# Patient Record
Sex: Female | Born: 1995 | Race: Black or African American | Hispanic: No | Marital: Single | Smoking: Former smoker
Health system: Southern US, Community
[De-identification: ages and names within clinical notes are randomized; demographics above are authoritative.]

## PROBLEM LIST (undated history)

## (undated) DIAGNOSIS — J45909 Unspecified asthma, uncomplicated: Secondary | ICD-10-CM

## (undated) DIAGNOSIS — K8050 Calculus of bile duct without cholangitis or cholecystitis without obstruction: Secondary | ICD-10-CM

## (undated) DIAGNOSIS — R519 Headache, unspecified: Secondary | ICD-10-CM

## (undated) DIAGNOSIS — J40 Bronchitis, not specified as acute or chronic: Secondary | ICD-10-CM

## (undated) DIAGNOSIS — R51 Headache: Secondary | ICD-10-CM

## (undated) DIAGNOSIS — T7840XA Allergy, unspecified, initial encounter: Secondary | ICD-10-CM

## (undated) DIAGNOSIS — T1490XA Injury, unspecified, initial encounter: Secondary | ICD-10-CM

## (undated) DIAGNOSIS — E669 Obesity, unspecified: Secondary | ICD-10-CM

## (undated) DIAGNOSIS — G8929 Other chronic pain: Secondary | ICD-10-CM

## (undated) DIAGNOSIS — F909 Attention-deficit hyperactivity disorder, unspecified type: Secondary | ICD-10-CM

## (undated) DIAGNOSIS — W3400XA Accidental discharge from unspecified firearms or gun, initial encounter: Secondary | ICD-10-CM

## (undated) DIAGNOSIS — S71139A Puncture wound without foreign body, unspecified thigh, initial encounter: Secondary | ICD-10-CM

## (undated) DIAGNOSIS — N926 Irregular menstruation, unspecified: Principal | ICD-10-CM

## (undated) DIAGNOSIS — E119 Type 2 diabetes mellitus without complications: Secondary | ICD-10-CM

## (undated) DIAGNOSIS — E161 Other hypoglycemia: Secondary | ICD-10-CM

## (undated) DIAGNOSIS — I10 Essential (primary) hypertension: Secondary | ICD-10-CM

## (undated) HISTORY — DX: Attention-deficit hyperactivity disorder, unspecified type: F90.9

## (undated) HISTORY — DX: Type 2 diabetes mellitus without complications: E11.9

## (undated) HISTORY — DX: Calculus of bile duct without cholangitis or cholecystitis without obstruction: K80.50

## (undated) HISTORY — DX: Other chronic pain: G89.29

## (undated) HISTORY — PX: TONSILLECTOMY: SUR1361

## (undated) HISTORY — PX: WISDOM TOOTH EXTRACTION: SHX21

## (undated) HISTORY — DX: Puncture wound without foreign body, unspecified thigh, initial encounter: S71.139A

## (undated) HISTORY — DX: Irregular menstruation, unspecified: N92.6

## (undated) HISTORY — DX: Headache, unspecified: R51.9

## (undated) HISTORY — DX: Accidental discharge from unspecified firearms or gun, initial encounter: W34.00XA

## (undated) HISTORY — DX: Essential (primary) hypertension: I10

## (undated) HISTORY — DX: Allergy, unspecified, initial encounter: T78.40XA

## (undated) HISTORY — DX: Morbid (severe) obesity due to excess calories: E66.01

## (undated) HISTORY — DX: Other hypoglycemia: E16.1

## (undated) HISTORY — DX: Obesity, unspecified: E66.9

## (undated) HISTORY — DX: Injury, unspecified, initial encounter: T14.90XA

## (undated) HISTORY — PX: OTHER SURGICAL HISTORY: SHX169

## (undated) HISTORY — DX: Headache: R51

---
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
## 2013-02-18 ENCOUNTER — Encounter (HOSPITAL_COMMUNITY): Payer: Self-pay

## 2013-02-18 ENCOUNTER — Emergency Department (HOSPITAL_COMMUNITY)
Admission: EM | Admit: 2013-02-18 | Discharge: 2013-02-18 | Disposition: A | Discharge status: Home / Self Care | Payer: Medicaid Other | Attending: Emergency Medicine | Admitting: Emergency Medicine

## 2013-02-18 DIAGNOSIS — Z880 Allergy status to penicillin: Secondary | ICD-10-CM | POA: Insufficient documentation

## 2013-02-18 DIAGNOSIS — N898 Other specified noninflammatory disorders of vagina: Secondary | ICD-10-CM | POA: Insufficient documentation

## 2013-02-18 DIAGNOSIS — J45909 Unspecified asthma, uncomplicated: Secondary | ICD-10-CM | POA: Insufficient documentation

## 2013-02-18 DIAGNOSIS — N939 Abnormal uterine and vaginal bleeding, unspecified: Secondary | ICD-10-CM

## 2013-02-18 DIAGNOSIS — E119 Type 2 diabetes mellitus without complications: Secondary | ICD-10-CM

## 2013-02-18 DIAGNOSIS — Z3202 Encounter for pregnancy test, result negative: Secondary | ICD-10-CM | POA: Insufficient documentation

## 2013-02-18 LAB — URINE MICROSCOPIC-ADD ON

## 2013-02-18 LAB — URINALYSIS, ROUTINE W REFLEX MICROSCOPIC
Glucose, UA: >1000 mg/dL — AB
Leukocytes, UA: NEGATIVE
Nitrite: NEGATIVE
Specific Gravity, Urine: 1.02 (ref 1.005–1.030)
pH: 6 (ref 5.0–8.0)

## 2013-02-18 LAB — PREGNANCY, URINE: Preg Test, Ur: NEGATIVE

## 2013-02-18 LAB — CBC WITH DIFFERENTIAL/PLATELET
Basophils Absolute: 0 10*3/uL (ref 0.0–0.1)
Lymphocytes Relative: 33 % (ref 12–46)
Lymphs Abs: 3.1 10*3/uL (ref 0.7–4.0)
Neutro Abs: 5.6 10*3/uL (ref 1.7–7.7)
Platelets: 351 10*3/uL (ref 150–400)
RBC: 5.2 MIL/uL — ABNORMAL HIGH (ref 3.87–5.11)
RDW: 14 % (ref 11.5–15.5)
WBC: 9.3 10*3/uL (ref 4.0–10.5)

## 2013-02-18 LAB — BASIC METABOLIC PANEL
CO2: 25 mEq/L (ref 19–32)
Calcium: 9.7 mg/dL (ref 8.4–10.5)
Chloride: 95 mEq/L — ABNORMAL LOW (ref 96–112)
Creatinine, Ser: 0.65 mg/dL (ref 0.50–1.10)
GFR calc Af Amer: >90 mL/min (ref >90)
Sodium: 132 mEq/L — ABNORMAL LOW (ref 135–145)

## 2013-02-18 LAB — WET PREP, GENITAL: Clue Cells Wet Prep HPF POC: NONE SEEN

## 2013-02-18 MED ORDER — METFORMIN HCL 500 MG PO TABS: 500.0000 mg | ORAL_TABLET | Freq: Two times a day (BID) | ORAL | Status: DC

## 2013-02-18 NOTE — ED Notes (Signed)
Patient with no complaints at this time. Respirations even and unlabored. Skin warm/dry. Discharge instructions reviewed with patient at this time. Patient given opportunity to voice concerns/ask questions. Patient discharged at this time and left Emergency Department with steady gait.   

## 2013-02-18 NOTE — ED Provider Notes (Signed)
History  This chart was scribed for Donnetta Hutching, MD by Bennett Scrape, ED Scribe. This patient was seen in room APA09/APA09 and the patient's care was started at 3:45 PM.  CSN: [id number]  Arrival date & time 02/18/13  1513   First MD Initiated Contact with Patient 02/18/13 1539      Chief Complaint  Patient presents with  . Vaginal Bleeding     The history is provided by the patient. No language interpreter was used.    HPI Comments: Michelle Tate is a 17 y.o. female who presents to the Emergency Department complaining of 6 days of gradually worsening, constant vaginal bleeding described as heavy with associated lower abdominal cramps attributed to her menses. Mother reports that she became concerned when the pt used the restroom today and passed several large clots. Mother states that the clots reminded her of a miscarriage and wanted the pt pregnancy tested. Pt admits that she is sexually active but uses condoms and has the implanon placed in January 2014. She reports not having a menses in February, a normal menses in March and a heavier than normal menses in April. She denies nausea, emesis, weakness and HA as associated symptoms. She has a h/o asthma and denies smoking and alcohol use.  NP Victorino Dike at [facility] PCP is Dr. Gerda Diss  Past Medical History  Diagnosis Date  . Asthma   . Bronchitis     Past Surgical History  Procedure Laterality Date  . Implanon placement    . Tonsillectomy      No family history on file.  History  Substance Use Topics  . Smoking status: Never Smoker   . Smokeless tobacco: Not on file  . Alcohol Use: No    No OB history provided.  Review of Systems  A complete 10 system review of systems was obtained and all systems are negative except as noted in the HPI and PMH.   Allergies  Penicillins and Sulfa drugs cross reactors  Home Medications   Current Outpatient Rx  Name  Route  Sig  Dispense  Refill  . naproxen (NAPROSYN) 500 MG  tablet   Oral   Take 1 tablet (500 mg total) by mouth 2 (two) times daily.   15 tablet   0     Triage Vitals: BP 153/93  Pulse 115  Temp(Src) 99.3 F (37.4 C) (Oral)  Resp 20  Ht 5\' 5"  (1.651 m)  Wt 300 lb (136.079 kg)  BMI 49.92 kg/m2  SpO2 100%  LMP 02/11/2013  Physical Exam  Nursing note and vitals reviewed. Constitutional: She is oriented to person, place, and time. She appears well-developed and well-nourished.  Obese   HENT:  Head: Normocephalic and atraumatic.  Eyes: Conjunctivae and EOM are normal. Pupils are equal, round, and reactive to light.  Neck: Normal range of motion. Neck supple.  Cardiovascular: Normal rate, regular rhythm and normal heart sounds.   Pulmonary/Chest: Effort normal and breath sounds normal.  Abdominal: Soft. Bowel sounds are normal. There is tenderness (minimal lower abdominal tenderness). There is no rebound and no guarding.  Genitourinary:  Normal external female genitalia, small amount of blood in the vaginal vault, no active bleeding from os, no CMT, normal adnexa, chaperone present  Musculoskeletal: Normal range of motion.  Neurological: She is alert and oriented to person, place, and time.  Skin: Skin is warm and dry.  Psychiatric: She has a normal mood and affect.    ED Course  Procedures (including critical care  time)  DIAGNOSTIC STUDIES: Oxygen Saturation is 100% on room air, normal by my interpretation.    COORDINATION OF CARE: 3:54 PM-Discussed treatment plan which includes pelvic exam, CBC panel, BMP and UA with pt at bedside and pt agreed to plan.   Labs Reviewed  BASIC METABOLIC PANEL - Abnormal; Notable for the following:    Sodium 132 (*)    Chloride 95 (*)    Glucose, Bld 396 (*)    All other components within normal limits  CBC WITH DIFFERENTIAL - Abnormal; Notable for the following:    RBC 5.20 (*)    MCV 77.7 (*)    MCH 25.8 (*)    All other components within normal limits  URINALYSIS, ROUTINE W REFLEX  MICROSCOPIC - Abnormal; Notable for the following:    APPearance CLOUDY (*)    Glucose, UA >1000 (*)    Hgb urine dipstick LARGE (*)    All other components within normal limits  URINE MICROSCOPIC-ADD ON - Abnormal; Notable for the following:    Squamous Epithelial / LPF FEW (*)    All other components within normal limits  PREGNANCY, URINE   No results found.   No diagnosis found.    MDM  Pelvic exam revealed minimal bleeding from os.   Glucose noted to be 396.   Will start patient on metformin 500 mg twice a day.    Will get follow up with both primary care and OB/GYN     I personally performed the services described in this documentation, which was scribed in my presence. The recorded information has been reviewed and is accurate.      Donnetta Hutching, MD 02/18/13 [phone]

## 2013-02-18 NOTE — ED Notes (Signed)
Pt reports that she has been bleeding heavy for 1 week, was on toilet apporx. 20 min pta and passed several large clots.  Mother got them out of the toilet and they want it checked to see if she was pregnant.  Has been having ab cramping all week.  Is on birth control.

## 2013-02-19 ENCOUNTER — Encounter: Payer: Self-pay | Admitting: *Deleted

## 2013-02-20 ENCOUNTER — Ambulatory Visit (INDEPENDENT_AMBULATORY_CARE_PROVIDER_SITE_OTHER): Payer: Medicaid Other | Admitting: Family Medicine

## 2013-02-20 ENCOUNTER — Encounter: Payer: Self-pay | Admitting: Family Medicine

## 2013-02-20 VITALS — BP 138/88 | HR 70 | Wt 329.0 lb

## 2013-02-20 DIAGNOSIS — N939 Abnormal uterine and vaginal bleeding, unspecified: Secondary | ICD-10-CM

## 2013-02-20 DIAGNOSIS — E1140 Type 2 diabetes mellitus with diabetic neuropathy, unspecified: Secondary | ICD-10-CM | POA: Insufficient documentation

## 2013-02-20 DIAGNOSIS — E118 Type 2 diabetes mellitus with unspecified complications: Secondary | ICD-10-CM | POA: Insufficient documentation

## 2013-02-20 DIAGNOSIS — E1165 Type 2 diabetes mellitus with hyperglycemia: Secondary | ICD-10-CM | POA: Insufficient documentation

## 2013-02-20 DIAGNOSIS — E119 Type 2 diabetes mellitus without complications: Secondary | ICD-10-CM | POA: Insufficient documentation

## 2013-02-20 DIAGNOSIS — E1169 Type 2 diabetes mellitus with other specified complication: Secondary | ICD-10-CM | POA: Insufficient documentation

## 2013-02-20 DIAGNOSIS — N898 Other specified noninflammatory disorders of vagina: Secondary | ICD-10-CM

## 2013-02-20 LAB — POCT GLYCOSYLATED HEMOGLOBIN (HGB A1C): Hemoglobin A1C: 11.7

## 2013-02-20 LAB — GC/CHLAMYDIA PROBE AMP: GC Probe RNA: NEGATIVE

## 2013-02-20 MED ORDER — METFORMIN HCL 500 MG PO TABS: ORAL_TABLET | ORAL | Status: DC

## 2013-02-20 NOTE — Progress Notes (Signed)
  Subjective:    Patient ID: Michelle Tate, female    DOB: Jan 11, 1996, 17 y.o.   MRN: [id number]  Diabetes She presents for her initial diabetic visit. She has type 2 diabetes mellitus. Onset time: unknown. There are no hypoglycemic associated symptoms. There are no hypoglycemic complications. When asked about current treatments, none were reported. She participates in exercise intermittently. An ACE inhibitor/angiotensin II receptor blocker is not being taken.   Strong fam hx of diabetes An in and patient has had a history of elevated sugar in the past. She also history of elevated insulin. Drinks a lot of soft drinks. A lot of sugar in her diet.  Review of Systems Some polyuria some polydipsia.   gradual weight gain. Objective:   Physical Exam Alert hydration decent. Blood pressure repeat 130/78. Significant obesity noted. Lungs clear. Heart regular in rhythm. Abdomen soft. H&T normal. Ankles without edema. Feet sensation intact pulses good.   Results for orders placed in visit on 02/20/13  POCT GLYCOSYLATED HEMOGLOBIN (HGB A1C)      Result Value Range   Hemoglobin A1C 11.7    GLUCOSE, POCT (MANUAL RESULT ENTRY)      Result Value Range   POC Glucose 11.7 (*) 70 - 99 mg/dl       Assessment & Plan:   impression #1 new onset diabetes type 2. Discussed at length. Bicarbonate 25 several days ago 345 glucose. Patient's diet is horrible. Plan cut supple sugars out of diet. Start walking. Glucometer prescribed. Initiate metformin. Side effects benefits discussed. Easily 35-40 minutes spent most in discussion. WSL

## 2013-02-20 NOTE — Patient Instructions (Signed)
Please stop all sugars in the diet immediately, and start exercising regulrly

## 2013-02-21 ENCOUNTER — Encounter: Payer: Self-pay | Admitting: *Deleted

## 2013-02-22 ENCOUNTER — Telehealth: Payer: Self-pay | Admitting: Adult Health

## 2013-02-22 NOTE — Telephone Encounter (Signed)
Pt having bleeding issues and has appt. 5/13 will address then

## 2013-02-26 ENCOUNTER — Encounter: Payer: Self-pay | Admitting: *Deleted

## 2013-02-26 DIAGNOSIS — J45909 Unspecified asthma, uncomplicated: Secondary | ICD-10-CM | POA: Insufficient documentation

## 2013-02-27 ENCOUNTER — Ambulatory Visit (INDEPENDENT_AMBULATORY_CARE_PROVIDER_SITE_OTHER): Payer: 59 | Admitting: Adult Health

## 2013-02-27 ENCOUNTER — Encounter: Payer: Self-pay | Admitting: Adult Health

## 2013-02-27 VITALS — BP 122/80 | Ht 64.0 in | Wt 371.6 lb

## 2013-02-27 DIAGNOSIS — E669 Obesity, unspecified: Secondary | ICD-10-CM

## 2013-02-27 DIAGNOSIS — Z3202 Encounter for pregnancy test, result negative: Secondary | ICD-10-CM

## 2013-02-27 DIAGNOSIS — N926 Irregular menstruation, unspecified: Secondary | ICD-10-CM

## 2013-02-27 HISTORY — DX: Irregular menstruation, unspecified: N92.6

## 2013-02-27 HISTORY — DX: Obesity, unspecified: E66.9

## 2013-02-27 LAB — POCT URINE PREGNANCY: Preg Test, Ur: NEGATIVE

## 2013-02-27 MED ORDER — MEGESTROL ACETATE 40 MG PO TABS: ORAL_TABLET | ORAL | Status: DC

## 2013-02-27 NOTE — Patient Instructions (Addendum)
Stop OCs and take megace and use condoms Sign up for my chart Return in 1 week for pelvic US

## 2013-02-27 NOTE — Progress Notes (Signed)
Subjective:     Patient ID: Michelle Tate, female   DOB: [birth year], 17 y.o.   MRN: [id number]  HPI Michelle Tate is a 17 year old black female with irregular bleeding. She has had bleeding since 02/01/13. And was seen in the ER 02/18/13. At that time her GC/CHL was negative and her A1c was 11.7, and she started Metformin. She went to the ER because she was passing clots. Has Prom on Saturday and does not want to bled.  Review of Systems Patient denies any headaches, blurred vision, shortness of breath, chest pain, abdominal pain, problems with bowel movements, urination, or intercourse. Positives as in HPI.    Reviewed past medical,surgical, social and family history. Reviewed medications and allergies.  Objective:   Physical Exam Blood pressure 122/80, height 5\' 4"  (1.626 m), weight 371 lb 9.6 oz (168.557 kg), last menstrual period 02/01/2013.Urine pregnancy test negative. Skin warm and dry.Pelvic: external genitalia is normal in appearance, vagina: has period type blood, no odor, cervix:smooth, with negative CMT, uterus: normal size, shape and contour, non tender, no masses felt, adnexa: no masses or tenderness noted.    Assessment:      Irregular bleeding   Recent diagnosis of diabetes  Obesity Plan:       Stop birth control pills today Start Megace 40 mg 3 x 5 days then 2 x 5 days then 1 daily, use condoms   Return in 1 week for pelvic US to assess uterine lining and see me

## 2013-03-08 ENCOUNTER — Ambulatory Visit: Payer: Medicaid Other | Admitting: Adult Health

## 2013-03-08 ENCOUNTER — Other Ambulatory Visit: Payer: Medicaid Other

## 2013-03-16 ENCOUNTER — Ambulatory Visit: Payer: Medicaid Other | Admitting: Adult Health

## 2013-03-16 ENCOUNTER — Other Ambulatory Visit: Payer: Medicaid Other

## 2013-03-27 ENCOUNTER — Other Ambulatory Visit: Payer: Self-pay | Admitting: Adult Health

## 2013-03-27 ENCOUNTER — Ambulatory Visit (INDEPENDENT_AMBULATORY_CARE_PROVIDER_SITE_OTHER): Payer: 59 | Admitting: Adult Health

## 2013-03-27 ENCOUNTER — Encounter: Payer: Self-pay | Admitting: Adult Health

## 2013-03-27 ENCOUNTER — Ambulatory Visit (INDEPENDENT_AMBULATORY_CARE_PROVIDER_SITE_OTHER): Payer: 59

## 2013-03-27 VITALS — BP 120/76 | Ht 64.0 in | Wt 373.4 lb

## 2013-03-27 DIAGNOSIS — N926 Irregular menstruation, unspecified: Secondary | ICD-10-CM

## 2013-03-27 DIAGNOSIS — E669 Obesity, unspecified: Secondary | ICD-10-CM

## 2013-03-27 DIAGNOSIS — Z309 Encounter for contraceptive management, unspecified: Secondary | ICD-10-CM

## 2013-03-27 NOTE — Progress Notes (Signed)
Subjective:     Patient ID: Michelle Tate, female   DOB: 1996/08/06, 17 y.o.   MRN: [id number]  HPI Perrie is back for [place] and review of how she is.The bleeding stopped with the megace and she started back on her birth control pills.She is feeling good. The US showed normal ovaries and some echogenic tissue in endometrium, no masses but difficult exam secondary to body habitus.  Review of Systems No complaints  Reviewed past medical,surgical, social and family history. Reviewed medications and allergies.     Objective:   Physical Exam BP 120/76  Ht 5\' 4"  (1.626 m)  Wt 373 lb 6.4 oz (169.373 kg)  BMI 64.06 kg/m2  LMP 02/24/2013 [place] reviewed with pt, Dr. Despina Hidden to review [place].    Assessment:      Contraceptive management History irregular bleeding-resolved   Obesity  Plan:      Take OCs, use condoms Follow up in 3 months Note given for work

## 2013-03-27 NOTE — Patient Instructions (Addendum)
Return in 3 months use condoms call with any problems

## 2013-07-16 ENCOUNTER — Encounter (HOSPITAL_COMMUNITY): Payer: Self-pay | Admitting: *Deleted

## 2013-07-16 ENCOUNTER — Emergency Department (HOSPITAL_COMMUNITY)
Admission: EM | Admit: 2013-07-16 | Discharge: 2013-07-16 | Disposition: A | Discharge status: Home / Self Care | Payer: 59 | Attending: Emergency Medicine | Admitting: Emergency Medicine

## 2013-07-16 DIAGNOSIS — Z8659 Personal history of other mental and behavioral disorders: Secondary | ICD-10-CM | POA: Insufficient documentation

## 2013-07-16 DIAGNOSIS — L02818 Cutaneous abscess of other sites: Secondary | ICD-10-CM | POA: Insufficient documentation

## 2013-07-16 DIAGNOSIS — R51 Headache: Secondary | ICD-10-CM | POA: Insufficient documentation

## 2013-07-16 DIAGNOSIS — E119 Type 2 diabetes mellitus without complications: Secondary | ICD-10-CM | POA: Insufficient documentation

## 2013-07-16 DIAGNOSIS — I10 Essential (primary) hypertension: Secondary | ICD-10-CM | POA: Insufficient documentation

## 2013-07-16 DIAGNOSIS — R1110 Vomiting, unspecified: Secondary | ICD-10-CM | POA: Insufficient documentation

## 2013-07-16 DIAGNOSIS — L02811 Cutaneous abscess of head [any part, except face]: Secondary | ICD-10-CM

## 2013-07-16 DIAGNOSIS — Z8742 Personal history of other diseases of the female genital tract: Secondary | ICD-10-CM | POA: Insufficient documentation

## 2013-07-16 DIAGNOSIS — Z79899 Other long term (current) drug therapy: Secondary | ICD-10-CM | POA: Insufficient documentation

## 2013-07-16 DIAGNOSIS — Z880 Allergy status to penicillin: Secondary | ICD-10-CM | POA: Insufficient documentation

## 2013-07-16 DIAGNOSIS — IMO0002 Reserved for concepts with insufficient information to code with codable children: Secondary | ICD-10-CM | POA: Insufficient documentation

## 2013-07-16 DIAGNOSIS — R000 Tachycardia, unspecified: Secondary | ICD-10-CM | POA: Insufficient documentation

## 2013-07-16 DIAGNOSIS — J45901 Unspecified asthma with (acute) exacerbation: Secondary | ICD-10-CM | POA: Insufficient documentation

## 2013-07-16 MED ORDER — CIPROFLOXACIN HCL 500 MG PO TABS: 500.0000 mg | ORAL_TABLET | Freq: Two times a day (BID) | ORAL | Status: DC

## 2013-07-16 MED ORDER — CIPROFLOXACIN HCL 250 MG PO TABS
500.0000 mg | ORAL_TABLET | Freq: Once | ORAL | Status: AC
  Administered 2013-07-16: 500 mg via ORAL
  Filled 2013-07-16: qty 2

## 2013-07-16 MED ORDER — HYDROCODONE-ACETAMINOPHEN 5-325 MG PO TABS: 1.0000 | ORAL_TABLET | ORAL | Status: DC | PRN

## 2013-07-16 MED ORDER — DOXYCYCLINE HYCLATE 100 MG PO TABS
100.0000 mg | ORAL_TABLET | Freq: Once | ORAL | Status: AC
  Administered 2013-07-16: 100 mg via ORAL
  Filled 2013-07-16: qty 1

## 2013-07-16 MED ORDER — ONDANSETRON HCL 4 MG PO TABS
4.0000 mg | ORAL_TABLET | Freq: Once | ORAL | Status: AC
  Administered 2013-07-16: 4 mg via ORAL
  Filled 2013-07-16: qty 1

## 2013-07-16 MED ORDER — KETOROLAC TROMETHAMINE 10 MG PO TABS
10.0000 mg | ORAL_TABLET | Freq: Once | ORAL | Status: AC
  Administered 2013-07-16: 10 mg via ORAL
  Filled 2013-07-16: qty 1

## 2013-07-16 MED ORDER — MUPIROCIN CALCIUM 2 % EX CREA: TOPICAL_CREAM | Freq: Three times a day (TID) | CUTANEOUS | Status: DC

## 2013-07-16 MED ORDER — DOXYCYCLINE HYCLATE 100 MG PO CAPS: 100.0000 mg | ORAL_CAPSULE | Freq: Two times a day (BID) | ORAL | Status: AC

## 2013-07-16 NOTE — ED Provider Notes (Signed)
CSN: [id number]     Arrival date & time 07/16/13  1559 History   First MD Initiated Contact with Patient 07/16/13 1653     Chief Complaint  Patient presents with  . Abscess   (Consider location/radiation/quality/duration/timing/severity/associated sxs/prior Treatment) Patient is a 17 y.o. female presenting with abscess. The history is provided by the patient.  Abscess Location:  Head/neck Head/neck abscess location:  Head and scalp Duration:  3 days Progression:  Worsening Chronicity:  New Context: diabetes   Relieved by:  Nothing Worsened by:  Draining/squeezing Ineffective treatments:  None tried Associated symptoms: headaches and vomiting   Associated symptoms: no fever     Past Medical History  Diagnosis Date  . Asthma   . Bronchitis   . ADHD (attention deficit hyperactivity disorder)   . Allergy   . Chronic headache   . Hypertension   . Hyperinsulinemia   . Morbid obesity   . Diabetes mellitus without complication   . Irregular bleeding 02/27/2013  . Obesity 02/27/2013   Past Surgical History  Procedure Laterality Date  . Implanon placement    . Tonsillectomy    . Wisdom tooth extraction     Family History  Problem Relation Age of Onset  . Stroke Other   . Cancer Other   . Diabetes Paternal Grandmother   . Hypertension Paternal Grandmother   . Hypertension Maternal Grandmother   . Heart disease Father   . Hypertension Father   . Seizures Mother   . Thyroid disease Cousin    History  Substance Use Topics  . Smoking status: Never Smoker   . Smokeless tobacco: Never Used  . Alcohol Use: No   OB History   Grav Para Term Preterm Abortions TAB SAB Ect Mult Living                 Review of Systems  Constitutional: Negative for fever and activity change.       All ROS Neg except as noted in HPI  HENT: Negative for nosebleeds and neck pain.   Eyes: Negative for photophobia and discharge.  Respiratory: Positive for wheezing. Negative for cough and  shortness of breath.   Cardiovascular: Negative for chest pain and palpitations.  Gastrointestinal: Positive for vomiting. Negative for abdominal pain and blood in stool.  Genitourinary: Negative for dysuria, frequency and hematuria.  Musculoskeletal: Negative for back pain and arthralgias.  Skin: Positive for wound.  Neurological: Positive for headaches. Negative for dizziness, seizures and speech difficulty.  Psychiatric/Behavioral: Negative for hallucinations and confusion.    Allergies  Penicillins and Sulfa drugs cross reactors  Home Medications   Current Outpatient Rx  Name  Route  Sig  Dispense  Refill  . Acetaminophen (TYLENOL PO)   Oral   Take by mouth.         Marland Kitchen albuterol (PROVENTIL HFA;VENTOLIN HFA) 108 (90 BASE) MCG/ACT inhaler   Inhalation   Inhale 2 puffs into the lungs every 6 (six) hours as needed for wheezing or shortness of breath.         . megestrol (MEGACE) 40 MG tablet      Take 3 x 5 days then 2 x 5 days then 1 daily   45 tablet   1   . metFORMIN (GLUCOPHAGE) 500 MG tablet      Take one tab twice per day for five days, then two bid   120 tablet   5   . norethindrone (MICRONOR,CAMILA,ERRIN) 0.35 MG tablet   Oral  Take 1 tablet by mouth daily.          BP 145/113  Pulse 105  Temp(Src) 98.5 F (36.9 C) (Oral)  Resp 18  Ht 5\' 4"  (1.626 m)  Wt 300 lb (136.079 kg)  BMI 51.47 kg/m2  SpO2 100%  LMP 06/25/2013 Physical Exam  Nursing note and vitals reviewed. Constitutional: She is oriented to person, place, and time. She appears well-developed and well-nourished.  Non-toxic appearance.  HENT:  Head: Normocephalic.  Right Ear: Tympanic membrane and external ear normal.  Left Ear: Tympanic membrane and external ear normal.  Small red tender area of the anterior scalp. One scabbed area present. The raised area and surrounding area is tender to palpation. The remainder of the scalp is sore. No other warm areas. No satellite lesion areas  appreciated.   Eyes: EOM and lids are normal. Pupils are equal, round, and reactive to light.  Neck: Normal range of motion. Neck supple. Carotid bruit is not present.  Cardiovascular: Regular rhythm, normal heart sounds, intact distal pulses and normal pulses.  Tachycardia present.   Pulmonary/Chest: Breath sounds normal. No respiratory distress.  Abdominal: Soft. Bowel sounds are normal. There is no tenderness. There is no guarding.  Musculoskeletal: Normal range of motion.  Lymphadenopathy:       Head (right side): No submandibular adenopathy present.       Head (left side): No submandibular adenopathy present.    She has no cervical adenopathy.  Neurological: She is alert and oriented to person, place, and time. She has normal strength. No cranial nerve deficit or sensory deficit.  Skin: Skin is warm and dry.  Psychiatric: She has a normal mood and affect. Her speech is normal.    ED Course  Procedures (including critical care time) Labs Review Labs Reviewed - No data to display Imaging Review No results found.  MDM  No diagnosis found. **I have reviewed nursing notes, vital signs, and all appropriate lab and imaging results for this patient.*  Pt feels that her hair braids were too tight or the scalp was injured by instrument during the fixing of the hair. She noted drainage of the abscess area last night. No reported high fever. She c/o the entire scalp being sore.  Plan- Pt to be started on cipro and doxycycline. She will be treated with bacitracin and norco as well. Pt to be rechecked in 3 days by Dr Gerda Diss, or in the ED.  Kathie Dike, PA-C 07/16/13 1726

## 2013-07-16 NOTE — ED Notes (Signed)
Swollen , red area to top of head, says her braids were "too tight"

## 2013-07-22 NOTE — ED Provider Notes (Signed)
Medical screening examination/treatment/procedure(s) were performed by non-physician practitioner and as supervising physician I was immediately available for consultation/collaboration.   Mirah Nevins J Merdith Boyd, MD 07/22/13 0803 

## 2013-10-19 ENCOUNTER — Ambulatory Visit: Payer: 59 | Admitting: Nurse Practitioner

## 2014-01-30 ENCOUNTER — Encounter (HOSPITAL_COMMUNITY): Payer: Self-pay | Admitting: Emergency Medicine

## 2014-01-30 ENCOUNTER — Emergency Department (HOSPITAL_COMMUNITY)
Admission: EM | Admit: 2014-01-30 | Discharge: 2014-01-30 | Disposition: A | Discharge status: Home / Self Care | Payer: 59 | Attending: Emergency Medicine | Admitting: Emergency Medicine

## 2014-01-30 ENCOUNTER — Emergency Department (HOSPITAL_COMMUNITY): Payer: 59

## 2014-01-30 DIAGNOSIS — G8929 Other chronic pain: Secondary | ICD-10-CM | POA: Insufficient documentation

## 2014-01-30 DIAGNOSIS — E119 Type 2 diabetes mellitus without complications: Secondary | ICD-10-CM | POA: Insufficient documentation

## 2014-01-30 DIAGNOSIS — J45909 Unspecified asthma, uncomplicated: Secondary | ICD-10-CM | POA: Insufficient documentation

## 2014-01-30 DIAGNOSIS — Z880 Allergy status to penicillin: Secondary | ICD-10-CM | POA: Insufficient documentation

## 2014-01-30 DIAGNOSIS — J40 Bronchitis, not specified as acute or chronic: Secondary | ICD-10-CM

## 2014-01-30 DIAGNOSIS — Z79899 Other long term (current) drug therapy: Secondary | ICD-10-CM | POA: Insufficient documentation

## 2014-01-30 DIAGNOSIS — I10 Essential (primary) hypertension: Secondary | ICD-10-CM | POA: Insufficient documentation

## 2014-01-30 DIAGNOSIS — Z8659 Personal history of other mental and behavioral disorders: Secondary | ICD-10-CM | POA: Insufficient documentation

## 2014-01-30 DIAGNOSIS — J329 Chronic sinusitis, unspecified: Secondary | ICD-10-CM | POA: Insufficient documentation

## 2014-01-30 DIAGNOSIS — R000 Tachycardia, unspecified: Secondary | ICD-10-CM | POA: Insufficient documentation

## 2014-01-30 MED ORDER — LORATADINE-PSEUDOEPHEDRINE ER 5-120 MG PO TB12: 1.0000 | ORAL_TABLET | Freq: Two times a day (BID) | ORAL | Status: DC

## 2014-01-30 MED ORDER — AZITHROMYCIN 250 MG PO TABS: 250.0000 mg | ORAL_TABLET | Freq: Every day | ORAL | Status: DC

## 2014-01-30 MED ORDER — PREDNISONE 10 MG PO TABS: ORAL_TABLET | ORAL | Status: DC

## 2014-01-30 MED ORDER — PROMETHAZINE-CODEINE 6.25-10 MG/5ML PO SYRP: 5.0000 mL | ORAL_SOLUTION | Freq: Four times a day (QID) | ORAL | Status: DC | PRN

## 2014-01-30 NOTE — Discharge Instructions (Signed)
Your chest x-ray is negative for pneumonia. Examination is consistent with sinusitis, and bronchitis. Please use Claritin-D, prednisone, and Zithromax daily, please take with food. Use promethazine codeine cough medication every 6 hours if needed for cough. This medication may cause drowsiness, please use with caution. Please increase fluids. Please wash hands frequently. Please see your primary physician, or return to the emergency department if any changes or problems. Please monitor your glucose carefully during the short course of your steroid use. Sinusitis Sinusitis is redness, soreness, and swelling (inflammation) of the paranasal sinuses. Paranasal sinuses are air pockets within the bones of your face (beneath the eyes, the middle of the forehead, or above the eyes). In healthy paranasal sinuses, mucus is able to drain out, and air is able to circulate through them by way of your nose. However, when your paranasal sinuses are inflamed, mucus and air can become trapped. This can allow bacteria and other germs to grow and cause infection. Sinusitis can develop quickly and last only a short time (acute) or continue over a long period (chronic). Sinusitis that lasts for more than 12 weeks is considered chronic.  CAUSES  Causes of sinusitis include:  Allergies.  Structural abnormalities, such as displacement of the cartilage that separates your nostrils (deviated septum), which can decrease the air flow through your nose and sinuses and affect sinus drainage.  Functional abnormalities, such as when the small hairs (cilia) that line your sinuses and help remove mucus do not work properly or are not present. SYMPTOMS  Symptoms of acute and chronic sinusitis are the same. The primary symptoms are pain and pressure around the affected sinuses. Other symptoms include:  Upper toothache.  Earache.  Headache.  Bad breath.  Decreased sense of smell and taste.  A cough, which worsens when you are  lying flat.  Fatigue.  Fever.  Thick drainage from your nose, which often is green and may contain pus (purulent).  Swelling and warmth over the affected sinuses. DIAGNOSIS  Your caregiver will perform a physical exam. During the exam, your caregiver may:  Look in your nose for signs of abnormal growths in your nostrils (nasal polyps).  Tap over the affected sinus to check for signs of infection.  View the inside of your sinuses (endoscopy) with a special imaging device with a light attached (endoscope), which is inserted into your sinuses. If your caregiver suspects that you have chronic sinusitis, one or more of the following tests may be recommended:  Allergy tests.  Nasal culture A sample of mucus is taken from your nose and sent to a lab and screened for bacteria.  Nasal cytology A sample of mucus is taken from your nose and examined by your caregiver to determine if your sinusitis is related to an allergy. TREATMENT  Most cases of acute sinusitis are related to a viral infection and will resolve on their own within 10 days. Sometimes medicines are prescribed to help relieve symptoms (pain medicine, decongestants, nasal steroid sprays, or saline sprays).  However, for sinusitis related to a bacterial infection, your caregiver will prescribe antibiotic medicines. These are medicines that will help kill the bacteria causing the infection.  Rarely, sinusitis is caused by a fungal infection. In theses cases, your caregiver will prescribe antifungal medicine. For some cases of chronic sinusitis, surgery is needed. Generally, these are cases in which sinusitis recurs more than 3 times per year, despite other treatments. HOME CARE INSTRUCTIONS   Drink plenty of water. Water helps thin the mucus so  your sinuses can drain more easily.  Use a humidifier.  Inhale steam 3 to 4 times a day (for example, sit in the bathroom with the shower running).  Apply a warm, moist washcloth to your  face 3 to 4 times a day, or as directed by your caregiver.  Use saline nasal sprays to help moisten and clean your sinuses.  Take over-the-counter or prescription medicines for pain, discomfort, or fever only as directed by your caregiver. SEEK IMMEDIATE MEDICAL CARE IF:  You have increasing pain or severe headaches.  You have nausea, vomiting, or drowsiness.  You have swelling around your face.  You have vision problems.  You have a stiff neck.  You have difficulty breathing. MAKE SURE YOU:   Understand these instructions.  Will watch your condition.  Will get help right away if you are not doing well or get worse. Document Released: 10/04/2005 Document Revised: 12/27/2011 Document Reviewed: 10/19/2011 [facility] Patient Information 2014 [place], [place].  Bronchitis Bronchitis is swelling (inflammation) of the air tubes leading to your lungs (bronchi). This causes mucus and a cough. If the swelling gets bad, you may have trouble breathing. HOME CARE   Rest.  Drink enough fluids to keep your pee (urine) clear or pale yellow (unless you have a condition where you have to watch how much you drink).  Only take medicine as told by your doctor. If you were given antibiotic medicines, finish them even if you start to feel better.  Avoid smoke, irritating chemicals, and strong smells. These make the problem worse. Quit smoking if you smoke. This helps your lungs heal faster.  Use a cool mist humidifier. Change the water in the humidifier every day. You can also sit in the bathroom with hot shower running for 5 10 minutes. Keep the door closed.  See your health care provider as told.  Wash your hands often. GET HELP IF: Your problems do not get better after 1 week. GET HELP RIGHT AWAY IF:   Your fever gets worse.  You have chills.  Your chest hurts.  Your problems breathing get worse.  You have blood in your mucus.  You pass out (faint).  You feel  lightheaded.  You have a bad headache.  You throw up (vomit) again and again. MAKE SURE YOU:  Understand these instructions.  Will watch your condition.  Will get help right away if you are not doing well or get worse. Document Released: 03/22/2008 Document Revised: 07/25/2013 Document Reviewed: 05/29/2013 [facility] Patient Information 2014 [place], [place].

## 2014-01-30 NOTE — ED Notes (Signed)
Pt reports has had cold symptoms since last Friday.  Reports since yesterday has had soreness in chest and ribs with coughing and deep breathing.  Reports since last night has spit up some blood tinged phlegm.  Denies fever or SOB.  Pt alert and oriented.

## 2014-01-30 NOTE — ED Provider Notes (Signed)
CSN: [id number]     Arrival date & time 01/30/14  [id number] History   First MD Initiated Contact with Patient 01/30/14 1120     Chief Complaint  Patient presents with  . Cough     (Consider location/radiation/quality/duration/timing/severity/associated sxs/prior Treatment) HPI Comments: The patient is a 18 year old female who presents to the emergency department with 5 days of cough, productive sputum, and chest soreness. The patient states that she has done so much coughing, but now she is sore in her rib areas. She states that on a few occasions she has had a yellow sputum with some bloody streaks in it. She has not had any high fever in the last few days. No unusual rash reported. She states that she does have a history of allergies, and has been having problems with this recently.  Patient is a 18 y.o. female presenting with cough. The history is provided by the patient.  Cough Cough characteristics:  Productive Sputum characteristics: yellow and blood tinged. Associated symptoms: no chest pain, no eye discharge, no shortness of breath and no wheezing     Past Medical History  Diagnosis Date  . Asthma   . Bronchitis   . ADHD (attention deficit hyperactivity disorder)   . Allergy   . Chronic headache   . Hypertension   . Hyperinsulinemia   . Morbid obesity   . Diabetes mellitus without complication   . Irregular bleeding 02/27/2013  . Obesity 02/27/2013   Past Surgical History  Procedure Laterality Date  . Implanon placement    . Tonsillectomy    . Wisdom tooth extraction     Family History  Problem Relation Age of Onset  . Stroke Other   . Cancer Other   . Diabetes Paternal Grandmother   . Hypertension Paternal Grandmother   . Hypertension Maternal Grandmother   . Heart disease Father   . Hypertension Father   . Seizures Mother   . Thyroid disease Cousin    History  Substance Use Topics  . Smoking status: Never Smoker   . Smokeless tobacco: Never Used  . Alcohol  Use: No   OB History   Grav Para Term Preterm Abortions TAB SAB Ect Mult Living                 Review of Systems  Constitutional: Negative for activity change.       All ROS Neg except as noted in HPI  HENT: Positive for congestion and postnasal drip. Negative for nosebleeds.        Nasal congestion  Eyes: Negative for photophobia and discharge.  Respiratory: Positive for cough. Negative for shortness of breath and wheezing.   Cardiovascular: Negative for chest pain and palpitations.  Gastrointestinal: Negative for abdominal pain and blood in stool.  Genitourinary: Negative for dysuria, frequency and hematuria.  Musculoskeletal: Negative for arthralgias, back pain and neck pain.  Skin: Negative.   Neurological: Negative for dizziness, seizures and speech difficulty.  Psychiatric/Behavioral: Negative for hallucinations and confusion.      Allergies  Penicillins and Sulfa drugs cross reactors  Home Medications   Prior to Admission medications   Medication Sig Start Date End Date Taking? Authorizing Provider  dextromethorphan-guaiFENesin (MUCINEX DM) 30-600 MG per 12 hr tablet Take 1 tablet by mouth 2 (two) times daily as needed for cough.   Yes Historical Provider, MD   BP 147/73  Pulse 105  Temp(Src) 98.3 F (36.8 C) (Oral)  Resp 18  Ht 5\' 4"  (1.626 m)  Wt  320 lb (145.151 kg)  BMI 54.90 kg/m2  SpO2 99%  LMP 12/19/2013 Physical Exam  Nursing note and vitals reviewed. Constitutional: She is oriented to person, place, and time. She appears well-developed and well-nourished.  Non-toxic appearance.  HENT:  Head: Normocephalic.  Right Ear: Tympanic membrane and external ear normal.  Left Ear: Tympanic membrane and external ear normal.  Nasal congestion present. Oropharynx is clear.  Eyes: EOM and lids are normal. Pupils are equal, round, and reactive to light.  Neck: Normal range of motion. Neck supple. Carotid bruit is not present.  Cardiovascular: Regular rhythm,  normal heart sounds, intact distal pulses and normal pulses.  Tachycardia present.   Pulmonary/Chest: Breath sounds normal. No respiratory distress.  Chest wall tenderness, left greater than right to palpation and with deep breathing.  Few scattered rhonchi present. Patient speaks in complete sentences.  Abdominal: Soft. Bowel sounds are normal. There is no tenderness. There is no guarding.  Musculoskeletal: Normal range of motion.  Lymphadenopathy:       Head (right side): No submandibular adenopathy present.       Head (left side): No submandibular adenopathy present.    She has no cervical adenopathy.  Neurological: She is alert and oriented to person, place, and time. She has normal strength. No cranial nerve deficit or sensory deficit.  Skin: Skin is warm and dry.  Psychiatric: She has a normal mood and affect. Her speech is normal.    ED Course  Procedures (including critical care time) Labs Review Labs Reviewed - No data to display  Imaging Review Dg Chest 2 View  01/30/2014   CLINICAL DATA:  COUGH  EXAM: CHEST  2 VIEW  COMPARISON:  None.  FINDINGS: Low lung volumes. The heart size and mediastinal contours are within normal limits. Both lungs are clear. The visualized skeletal structures are unremarkable.  IMPRESSION: No active cardiopulmonary disease.   Electronically Signed   By: Salome HolmesHector  Cooper M.D.   On: 01/30/2014 11:48     EKG Interpretation None      MDM The chest x-ray is normal. The vital signs are well within normal limits with exception of the pulse rate being 105. The pulse oximetry is 99% on room air. Within normal limits by my interpretation.  The plan at this time will be for the patient to receive a prescription for Claritin-D, promethazine-codeine cough medication, and Zithromax.    Final diagnoses:  None    **I have reviewed nursing notes, vital signs, and all appropriate lab and imaging results for this patient.Kathie Dike*    Tyron Manetta M Maeola Mchaney, PA-C 01/30/14  1231

## 2014-01-30 NOTE — ED Provider Notes (Signed)
Medical screening examination/treatment/procedure(s) were conducted as a shared visit with non-physician practitioner(s) and myself.  I personally evaluated the patient during the encounter.   EKG Interpretation None       Donnetta HutchingBrian Jagger Beahm, MD 01/30/14 1513

## 2014-01-30 NOTE — ED Notes (Signed)
Pt co cough x 5 days, productive, yellow sputum, co soreness in ribs from coughing.

## 2014-02-01 ENCOUNTER — Ambulatory Visit: Payer: 59 | Admitting: Family Medicine

## 2014-02-06 ENCOUNTER — Encounter: Payer: Self-pay | Admitting: Nurse Practitioner

## 2014-02-06 ENCOUNTER — Ambulatory Visit (INDEPENDENT_AMBULATORY_CARE_PROVIDER_SITE_OTHER): Payer: 59 | Admitting: Nurse Practitioner

## 2014-02-06 VITALS — BP 132/90 | Temp 98.4°F | Ht 64.0 in | Wt 321.0 lb

## 2014-02-06 DIAGNOSIS — J019 Acute sinusitis, unspecified: Secondary | ICD-10-CM

## 2014-02-06 DIAGNOSIS — H6690 Otitis media, unspecified, unspecified ear: Secondary | ICD-10-CM

## 2014-02-06 DIAGNOSIS — H6692 Otitis media, unspecified, left ear: Secondary | ICD-10-CM

## 2014-02-06 MED ORDER — LEVOFLOXACIN 500 MG PO TABS: 500.0000 mg | ORAL_TABLET | Freq: Every day | ORAL | Status: DC

## 2014-02-06 MED ORDER — HYDROCODONE-HOMATROPINE 5-1.5 MG/5ML PO SYRP: 5.0000 mL | ORAL_SOLUTION | ORAL | Status: DC | PRN

## 2014-02-07 ENCOUNTER — Encounter: Payer: Self-pay | Admitting: Nurse Practitioner

## 2014-02-07 NOTE — Progress Notes (Signed)
Subjective:  Presents for recheck after ER visit on 4/15 for sinusitis/bronchitis. Has completed her prednisone, wheezing has improved. Continues to use her inhaler at night due to frequent cough. Could not take codeine/Phenergan syrup due to side effects. Completed her course of Zithromax. Continues to have increased cough at night. Producing green sputum. Headache/pressure between the eyes. No sore throat or ear pain. No fever.  Objective:   BP 132/90  Temp(Src) 98.4 F (36.9 C)  Ht 5\' 4"  (1.626 m)  Wt 321 lb (145.605 kg)  BMI 55.07 kg/m2  LMP 12/19/2013 NAD. Alert, oriented. Right TM clear effusion. Left TM yellowish effusion with three fourths of the TM having erythema. Pharynx injected with PND noted. Neck supple with mild soft and tear adenopathy. Lungs clear. Heart regular rate rhythm.  Assessment: Sinusitis, acute  Otitis media of left ear  Plan: Meds ordered this encounter  Medications  . levofloxacin (LEVAQUIN) 500 MG tablet    Sig: Take 1 tablet (500 mg total) by mouth daily.    Dispense:  10 tablet    Refill:  0    Order Specific Question:  Supervising Provider    Answer:  Merlyn AlbertLUKING, WILLIAM S [[id number]]  . HYDROcodone-homatropine (HYCODAN) 5-1.5 MG/5ML syrup    Sig: Take 5 mLs by mouth every 4 (four) hours as needed.    Dispense:  120 mL    Refill:  0    Order Specific Question:  Supervising Provider    Answer:  Merlyn AlbertLUKING, WILLIAM S [[id number]]   OTC meds as directed for daytime use. Warning signs reviewed. Call back in 7-10 days if no improvement, sooner if worse.

## 2014-02-20 ENCOUNTER — Encounter: Payer: Self-pay | Admitting: Nurse Practitioner

## 2014-02-20 ENCOUNTER — Ambulatory Visit (INDEPENDENT_AMBULATORY_CARE_PROVIDER_SITE_OTHER): Payer: 59 | Admitting: Nurse Practitioner

## 2014-02-20 VITALS — BP 132/98 | HR 80 | Ht 66.0 in | Wt 314.0 lb

## 2014-02-20 DIAGNOSIS — N949 Unspecified condition associated with female genital organs and menstrual cycle: Secondary | ICD-10-CM

## 2014-02-20 DIAGNOSIS — H6691 Otitis media, unspecified, right ear: Secondary | ICD-10-CM

## 2014-02-20 DIAGNOSIS — Z0000 Encounter for general adult medical examination without abnormal findings: Secondary | ICD-10-CM

## 2014-02-20 DIAGNOSIS — H6690 Otitis media, unspecified, unspecified ear: Secondary | ICD-10-CM

## 2014-02-20 DIAGNOSIS — R5381 Other malaise: Secondary | ICD-10-CM

## 2014-02-20 DIAGNOSIS — N938 Other specified abnormal uterine and vaginal bleeding: Secondary | ICD-10-CM

## 2014-02-20 DIAGNOSIS — N925 Other specified irregular menstruation: Secondary | ICD-10-CM

## 2014-02-20 DIAGNOSIS — E119 Type 2 diabetes mellitus without complications: Secondary | ICD-10-CM

## 2014-02-20 DIAGNOSIS — R5383 Other fatigue: Secondary | ICD-10-CM

## 2014-02-20 MED ORDER — NORETHINDRONE 0.35 MG PO TABS: 1.0000 | ORAL_TABLET | Freq: Every day | ORAL | Status: DC

## 2014-02-20 MED ORDER — CEFDINIR 300 MG PO CAPS: 300.0000 mg | ORAL_CAPSULE | Freq: Two times a day (BID) | ORAL | Status: DC

## 2014-02-20 MED ORDER — METFORMIN HCL 500 MG PO TABS: 500.0000 mg | ORAL_TABLET | Freq: Two times a day (BID) | ORAL | Status: DC

## 2014-02-22 ENCOUNTER — Telehealth: Payer: Self-pay | Admitting: Nurse Practitioner

## 2014-02-22 ENCOUNTER — Encounter: Payer: Self-pay | Admitting: Nurse Practitioner

## 2014-02-22 DIAGNOSIS — N938 Other specified abnormal uterine and vaginal bleeding: Secondary | ICD-10-CM | POA: Insufficient documentation

## 2014-02-22 NOTE — Telephone Encounter (Signed)
She is scheduled to come in Tues Morning

## 2014-02-22 NOTE — Telephone Encounter (Signed)
Actually does not have PCN in it. A small percentage of people allergic to PCN are allergic to these meds. However, she took a similar med to [place]f a few years ago and did fine. I checked before I prescribed it.

## 2014-02-22 NOTE — Progress Notes (Signed)
Subjective:    Patient ID: Michelle Tate, female    DOB: 11/09/1995, 18 y.o.   MRN: [id number]  HPI Presents for her wellness exam. Has been diagnosed with diabetes but not currently on medication. On oc's, had light spotting and bad cramps with this pack but not usually a problem. Has been with current partner almost 3 years. Needs refills on Metformin. Overall healthy diet. Regular exercise. Difficulty losing weight. Regular dental exams. No eye exam. No numbness or pain in the feet. Very strong fm hx of diabetes.   Review of Systems  Constitutional: Positive for fatigue. Negative for fever, activity change and appetite change.  HENT: Positive for postnasal drip, rhinorrhea and sinus pressure. Negative for dental problem, ear pain and sore throat.   Eyes: Negative for visual disturbance.  Respiratory: Negative for cough, chest tightness, shortness of breath and wheezing.   Cardiovascular: Negative for chest pain and leg swelling.  Gastrointestinal: Negative for nausea, vomiting, abdominal pain, diarrhea, constipation and abdominal distention.  Genitourinary: Positive for menstrual problem. Negative for dysuria, urgency, frequency, vaginal discharge, enuresis, difficulty urinating, genital sores and pelvic pain.       Objective:   Physical Exam  Vitals reviewed. Constitutional: She is oriented to person, place, and time. She appears well-developed. No distress.  HENT:  Right Ear: External ear normal.  Left Ear: External ear normal.  Mouth/Throat: Oropharynx is clear and moist.  Lt TM clear effusion; Rt TM dull with moderate erythema; pharynx cloudy PND  Neck: Normal range of motion. Neck supple. No tracheal deviation present. No thyromegaly present.  Cardiovascular: Normal rate, regular rhythm and normal heart sounds.  Exam reveals no gallop.   No murmur heard. Pulmonary/Chest: Effort normal and breath sounds normal. She has no wheezes. She has no rales.  Abdominal: Soft. She  exhibits no distension. There is no tenderness.  Genitourinary:  Breast and GU exam deferred; patient denies any problems. Also patient is on cycle.  Musculoskeletal: She exhibits no edema.  Lymphadenopathy:    She has cervical adenopathy.  Neurological: She is alert and oriented to person, place, and time.  Skin: Skin is warm and dry. No rash noted.  Significant acanthosis nigricans noted on posterior neck area.  Psychiatric: She has a normal mood and affect. Her behavior is normal.          Assessment & Plan:   Problem List Items Addressed This Visit     Endocrine   Diabetes   Relevant Medications      metFORMIN (GLUCOPHAGE) tablet   Other Relevant Orders      Basic metabolic panel      Hemoglobin A1c      Lipid panel      Microalbumin, urine     Other   Morbid obesity   Relevant Medications      metFORMIN (GLUCOPHAGE) tablet   DUB (dysfunctional uterine bleeding)   Relevant Orders      CBC with Differential    Other Visit Diagnoses   Well woman exam (no gynecological exam)    -  Primary    Relevant Orders       Basic metabolic panel       Hepatic function panel       Lipid panel       TSH    Otitis media of right ear        Relevant Medications       cefdinir (OMNICEF) capsule 300 mg    Fatigue  Relevant Orders       Basic metabolic panel       Hepatic function panel       TSH      Plan: recommend eye exam sometime this year due to diabetes. Meds ordered this encounter  Medications  . metFORMIN (GLUCOPHAGE) 500 MG tablet    Sig: Take 1 tablet (500 mg total) by mouth 2 (two) times daily with a meal.    Dispense:  60 tablet    Refill:  5    Order Specific Question:  Supervising Provider    Answer:  Merlyn AlbertLUKING, WILLIAM S [[id number]]  . norethindrone (MICRONOR,CAMILA,ERRIN) 0.35 MG tablet    Sig: Take 1 tablet (0.35 mg total) by mouth daily.    Dispense:  1 Package    Refill:  11    Order Specific Question:  Supervising Provider    Answer:  Merlyn AlbertLUKING,  WILLIAM S [[id number]]  . cefdinir (OMNICEF) 300 MG capsule    Sig: Take 1 capsule (300 mg total) by mouth 2 (two) times daily.    Dispense:  20 capsule    Refill:  0    Has taken cephalosporins without difficulty in the past    Order Specific Question:  Supervising Provider    Answer:  Riccardo DubinLUKING, WILLIAM S [[id number]]   Recommend nurse visit in the near future for instructions on Tanzeum injection. Encouraged healthy diet; stop all regular soda, sweet tea or juice. Limit simple carbs. Regular exercise. Daily vitamin D and calcium in diet.  Return in about 3 months (around 05/23/2014).

## 2014-02-22 NOTE — Telephone Encounter (Signed)
This a new med; I am not aware of cross sensitivity to PCN but will check. We have a sample here and she will need a demonstration on how to mix and administer. She can do this next week if [facility]

## 2014-02-22 NOTE — Telephone Encounter (Signed)
Notified patient actually does not have PCN in it. A small percentage of people allergic to PCN are allergic to these meds. However, she took a similar med to [place] a few years [place] and did fine. She checked before she prescribed it. Patient verbalized understanding.

## 2014-02-22 NOTE — Telephone Encounter (Signed)
Pt states she could not come in today for her med instructions °Then she stated that the PharmD would not prescribe  °The med to her anyway's because she states she has an allergy  °To penicillin, this med has penicillin in it.  ° °What would you like to do at this point?  °

## 2014-02-22 NOTE — Telephone Encounter (Signed)
Patient [facility] her at her office visit. The pharmacist told her it has penicillin in it and she is allergic. She wants it changed to another medication if possible.

## 2014-04-10 ENCOUNTER — Other Ambulatory Visit: Payer: Self-pay | Admitting: Adult Health

## 2014-05-20 ENCOUNTER — Ambulatory Visit: Payer: 59 | Admitting: Adult Health

## 2014-08-01 ENCOUNTER — Encounter: Payer: Self-pay | Admitting: Nurse Practitioner

## 2014-08-01 ENCOUNTER — Ambulatory Visit (INDEPENDENT_AMBULATORY_CARE_PROVIDER_SITE_OTHER): Payer: 59 | Admitting: Nurse Practitioner

## 2014-08-01 VITALS — BP 140/90 | Ht 66.0 in | Wt 360.0 lb

## 2014-08-01 DIAGNOSIS — N938 Other specified abnormal uterine and vaginal bleeding: Secondary | ICD-10-CM

## 2014-08-01 DIAGNOSIS — R5383 Other fatigue: Secondary | ICD-10-CM

## 2014-08-01 DIAGNOSIS — Z3009 Encounter for other general counseling and advice on contraception: Secondary | ICD-10-CM

## 2014-08-01 DIAGNOSIS — Z13220 Encounter for screening for lipoid disorders: Secondary | ICD-10-CM

## 2014-08-01 DIAGNOSIS — Z308 Encounter for other contraceptive management: Secondary | ICD-10-CM

## 2014-08-01 DIAGNOSIS — Z23 Encounter for immunization: Secondary | ICD-10-CM

## 2014-08-01 DIAGNOSIS — G629 Polyneuropathy, unspecified: Secondary | ICD-10-CM

## 2014-08-01 DIAGNOSIS — J069 Acute upper respiratory infection, unspecified: Secondary | ICD-10-CM

## 2014-08-01 DIAGNOSIS — E1142 Type 2 diabetes mellitus with diabetic polyneuropathy: Secondary | ICD-10-CM

## 2014-08-01 DIAGNOSIS — IMO0002 Reserved for concepts with insufficient information to code with codable children: Secondary | ICD-10-CM

## 2014-08-01 DIAGNOSIS — E1165 Type 2 diabetes mellitus with hyperglycemia: Secondary | ICD-10-CM

## 2014-08-01 LAB — POCT GLYCOSYLATED HEMOGLOBIN (HGB A1C): Hemoglobin A1C: 10.2

## 2014-08-01 LAB — POCT URINE PREGNANCY: Preg Test, Ur: NEGATIVE

## 2014-08-01 MED ORDER — LISINOPRIL-HYDROCHLOROTHIAZIDE 10-12.5 MG PO TABS: 1.0000 | ORAL_TABLET | Freq: Every day | ORAL | Status: DC

## 2014-08-01 MED ORDER — MEDROXYPROGESTERONE ACETATE 150 MG/ML IM SUSP
150.0000 mg | Freq: Once | INTRAMUSCULAR | Status: AC
  Administered 2014-08-01: 150 mg via INTRAMUSCULAR

## 2014-08-01 MED ORDER — LIRAGLUTIDE 18 MG/3ML ~~LOC~~ SOPN: PEN_INJECTOR | SUBCUTANEOUS | Status: DC

## 2014-08-01 MED ORDER — "PEN NEEDLES 5/16"" 31G X 8 MM MISC": Status: DC

## 2014-08-02 ENCOUNTER — Telehealth: Payer: Self-pay | Admitting: Nurse Practitioner

## 2014-08-02 ENCOUNTER — Encounter: Payer: Self-pay | Admitting: Nurse Practitioner

## 2014-08-02 DIAGNOSIS — E1142 Type 2 diabetes mellitus with diabetic polyneuropathy: Secondary | ICD-10-CM | POA: Insufficient documentation

## 2014-08-02 MED ORDER — AZITHROMYCIN 250 MG PO TABS: ORAL_TABLET | ORAL | Status: DC

## 2014-08-02 NOTE — Telephone Encounter (Signed)
Addendum to notes yesterday: patient denies family history of thyroid cancer.

## 2014-08-02 NOTE — Telephone Encounter (Signed)
Victoza IS NOT AN INSULIN. It is an incretin. Yes keep taking Metformin. Start Victoza as directed.

## 2014-08-02 NOTE — Telephone Encounter (Signed)
Patient said that yesterday Eber JonesCarolyn told her that Victoza was not insulin, but she spoke with the pharmacist and they said that it is insulin.  She wants to know if she should still take her glucose pill?

## 2014-08-02 NOTE — Progress Notes (Signed)
Subjective:  Presents for routine followup on her diabetes. Has not had her lab work done from previous visit. Has had routine eye exams. Experiencing some numbness and pain in the feet near the toes. Has not been checking her sugar at home. Compliant with metformin. Would like medication to help her with occasional edema in the lower legs. No chest pain/ischemic type pain or shortness of breath. Limited exercise due to work schedule. Has been trying to do better with her diabetic diet. Has cut out regular soda. Also of complaints of sinus symptoms over the past 3 days. No fever. Slight headache. Frequent cough producing green sputum. Slight wheeze at times. No sore throat or ear pain. Nonsmoker. Continues to have frequent bleeding on Micronor, note she had the same problem with Nexplanon. Would like to switch to Depo-Provera. Is on her cycle today.  Objective:   BP 140/90  Ht 5\' 6"  (1.676 m)  Wt 360 lb (163.295 kg)  BMI 58.13 kg/m2 NAD. Alert, oriented. TMs clear effusion, no erythema. Pharynx injected with green PND noted. Neck supple with mild soft anterior adenopathy. Lungs clear. Heart regular rate rhythm. Lower extremities trace pitting edema. See diabetic foot exam. Results for orders placed in visit on 08/01/14  POCT URINE PREGNANCY      Result Value Ref Range   Preg Test, Ur Negative    POCT GLYCOSYLATED HEMOGLOBIN (HGB A1C)      Result Value Ref Range   Hemoglobin A1C 10.2     last recorded hemoglobin A1c was 11.7 before this visit.   Assessment:  Problem List Items Addressed This Visit     Endocrine   Diabetes   Relevant Medications      LISINOPRIL-HCTZ 10-12.5 MG PO TABS      Liraglutide (VICTOZA) 18 MG/3ML SOPN     Nervous and Auditory   Diabetic peripheral neuropathy associated with type 2 diabetes mellitus   Relevant Medications      LISINOPRIL-HCTZ 10-12.5 MG PO TABS      Liraglutide (VICTOZA) 18 MG/3ML SOPN     Other   Morbid obesity   Relevant Medications   Liraglutide (VICTOZA) 18 MG/3ML SOPN   Other Relevant Orders      Hepatic function panel      CBC with Differential      Basic metabolic panel      TSH   DUB (dysfunctional uterine bleeding) - Primary    Other Visit Diagnoses   Other fatigue        Relevant Orders       Hepatic function panel       CBC with Differential       Basic metabolic panel       TSH    Screening for lipid disorders        Relevant Orders       Lipid panel    Encounter for immunization        Encounter for other general counseling or advice on contraception        Relevant Medications       medroxyPROGESTERone (DEPO-PROVERA) injection 150 mg (Completed)    Other Relevant Orders       POCT urine pregnancy (Completed)    Encounter for other contraceptive management        Relevant Orders       POCT urine pregnancy (Completed)    Acute upper respiratory infection        Relevant Medications       azithromycin (ZITHROMAX)  tablet        Plan:  Meds ordered this encounter  Medications  . lisinopril-hydrochlorothiazide (PRINZIDE,ZESTORETIC) 10-12.5 MG per tablet    Sig: Take 1 tablet by mouth daily.    Dispense:  30 tablet    Refill:  5    Order Specific Question:  Supervising Provider    Answer:  Merlyn AlbertLUKING, WILLIAM S [[id number]]  . Liraglutide (VICTOZA) 18 MG/3ML SOPN    Sig: 0.6 mg subcu once a day x 7 days, then 1.2 mg subcu once daily    Dispense:  2 pen    Refill:  2    Order Specific Question:  Supervising Provider    Answer:  Merlyn AlbertLUKING, WILLIAM S [[id number]]  . Insulin Pen Needle (PEN NEEDLES 31GX5/16") 31G X 8 MM MISC    Sig: Use daily with Victoza    Dispense:  100 each    Refill:  0    Order Specific Question:  Supervising Provider    Answer:  Merlyn AlbertLUKING, WILLIAM S [[id number]]  . medroxyPROGESTERone (DEPO-PROVERA) injection 150 mg    Sig:   . azithromycin (ZITHROMAX Z-PAK) 250 MG tablet    Sig: Take 2 tablets (500 mg) on  Day 1,  followed by 1 tablet (250 mg) once daily on Days 2 through 5.    Dispense:  6  each    Refill:  0    Order Specific Question:  Supervising Provider    Answer:  Merlyn AlbertLUKING, WILLIAM S [[id number]]    Start Zestoretic 10/12.5 with lisinopril for diabetes and blood pressure HCTZ to help with edema. Add Victoza to regimen. Continue metformin as directed. Discussed at length the importance of significant weight loss and regular activity. Start Depo-Provera 150 mg IM today. Call back if any heavy or prolonged bleeding. Return in about 6 weeks (around 09/12/2014). Call back sooner if any problems.

## 2014-08-02 NOTE — Telephone Encounter (Signed)
Patient notified and verbalized understanding. 

## 2014-08-19 ENCOUNTER — Other Ambulatory Visit: Payer: Self-pay | Admitting: Family Medicine

## 2014-09-06 ENCOUNTER — Ambulatory Visit (INDEPENDENT_AMBULATORY_CARE_PROVIDER_SITE_OTHER): Payer: 59 | Admitting: Nurse Practitioner

## 2014-09-06 ENCOUNTER — Encounter: Payer: Self-pay | Admitting: Nurse Practitioner

## 2014-09-06 VITALS — BP 126/80 | Ht 66.0 in | Wt 357.0 lb

## 2014-09-06 DIAGNOSIS — E1142 Type 2 diabetes mellitus with diabetic polyneuropathy: Secondary | ICD-10-CM

## 2014-09-06 DIAGNOSIS — G629 Polyneuropathy, unspecified: Secondary | ICD-10-CM

## 2014-09-06 DIAGNOSIS — E1169 Type 2 diabetes mellitus with other specified complication: Secondary | ICD-10-CM

## 2014-09-06 MED ORDER — LIRAGLUTIDE 18 MG/3ML ~~LOC~~ SOPN: PEN_INJECTOR | SUBCUTANEOUS | Status: DC

## 2014-09-11 ENCOUNTER — Encounter: Payer: Self-pay | Admitting: Nurse Practitioner

## 2014-09-11 NOTE — Progress Notes (Signed)
Subjective:  Presents for recheck on her diabetes. Is down 2 pants sizes. FBS this am 136. BS consistently less than 200. Currently on Victoza 1.2 mg qd. Minimal nausea which has resolved. Has started regular walking program.   Objective:   BP 126/80 mmHg  Ht 5\' 6"  (1.676 m)  Wt 357 lb (161.934 kg)  BMI 57.65 kg/m2 NAD. Alert, oriented. Lungs clear. Heart RRR.   Assessment:  Problem List Items Addressed This Visit      Endocrine   Diabetes   Relevant Medications      Liraglutide (VICTOZA) 18 MG/3ML SOPN     Nervous and Auditory   Diabetic peripheral neuropathy associated with type 2 diabetes mellitus - Primary   Relevant Medications      Liraglutide (VICTOZA) 18 MG/3ML SOPN     Other   Morbid obesity   Relevant Medications      Liraglutide (VICTOZA) 18 MG/3ML SOPN     Plan: Continue lifestyle changes.  Meds ordered this encounter  Medications  . Liraglutide (VICTOZA) 18 MG/3ML SOPN    Sig: 0.6 mg subcu once a day x 7 days, then 1.2 mg subcu once daily    Dispense:  2 pen    Refill:  5    Order Specific Question:  Supervising Provider    Answer:  Merlyn AlbertLUKING, WILLIAM S [[id number]]   Continue Victoza as directed. Obtain labs as planned.  Return in about 3 months (around 12/07/2014). Repeat HbgA1c at that time.

## 2014-09-24 ENCOUNTER — Encounter (HOSPITAL_COMMUNITY): Payer: Self-pay | Admitting: Emergency Medicine

## 2014-09-24 ENCOUNTER — Emergency Department (HOSPITAL_COMMUNITY)
Admission: EM | Admit: 2014-09-24 | Discharge: 2014-09-24 | Disposition: A | Discharge status: Home / Self Care | Payer: 59 | Attending: Emergency Medicine | Admitting: Emergency Medicine

## 2014-09-24 DIAGNOSIS — E669 Obesity, unspecified: Secondary | ICD-10-CM | POA: Diagnosis not present

## 2014-09-24 DIAGNOSIS — R1010 Upper abdominal pain, unspecified: Secondary | ICD-10-CM

## 2014-09-24 DIAGNOSIS — Z8659 Personal history of other mental and behavioral disorders: Secondary | ICD-10-CM | POA: Insufficient documentation

## 2014-09-24 DIAGNOSIS — Z87448 Personal history of other diseases of urinary system: Secondary | ICD-10-CM | POA: Diagnosis not present

## 2014-09-24 DIAGNOSIS — R197 Diarrhea, unspecified: Secondary | ICD-10-CM | POA: Diagnosis not present

## 2014-09-24 DIAGNOSIS — I10 Essential (primary) hypertension: Secondary | ICD-10-CM | POA: Diagnosis not present

## 2014-09-24 DIAGNOSIS — G8929 Other chronic pain: Secondary | ICD-10-CM | POA: Insufficient documentation

## 2014-09-24 DIAGNOSIS — R112 Nausea with vomiting, unspecified: Secondary | ICD-10-CM | POA: Diagnosis present

## 2014-09-24 DIAGNOSIS — Z3202 Encounter for pregnancy test, result negative: Secondary | ICD-10-CM | POA: Insufficient documentation

## 2014-09-24 DIAGNOSIS — E119 Type 2 diabetes mellitus without complications: Secondary | ICD-10-CM | POA: Diagnosis not present

## 2014-09-24 DIAGNOSIS — J45909 Unspecified asthma, uncomplicated: Secondary | ICD-10-CM | POA: Insufficient documentation

## 2014-09-24 DIAGNOSIS — R1013 Epigastric pain: Secondary | ICD-10-CM | POA: Diagnosis not present

## 2014-09-24 LAB — URINALYSIS, ROUTINE W REFLEX MICROSCOPIC
Glucose, UA: NEGATIVE mg/dL
HGB URINE DIPSTICK: NEGATIVE
KETONES UR: NEGATIVE mg/dL
Leukocytes, UA: NEGATIVE
Nitrite: NEGATIVE
Specific Gravity, Urine: 1.02 (ref 1.005–1.030)
UROBILINOGEN UA: 0.2 mg/dL (ref 0.0–1.0)
pH: 6 (ref 5.0–8.0)

## 2014-09-24 LAB — COMPREHENSIVE METABOLIC PANEL
ALBUMIN: 3.4 g/dL — AB (ref 3.5–5.2)
ALT: 13 U/L (ref 0–35)
ANION GAP: 13 (ref 5–15)
AST: 13 U/L (ref 0–37)
Alkaline Phosphatase: 108 U/L (ref 39–117)
BUN: 9 mg/dL (ref 6–23)
CALCIUM: 9.6 mg/dL (ref 8.4–10.5)
CO2: 25 mEq/L (ref 19–32)
CREATININE: 0.73 mg/dL (ref 0.50–1.10)
Chloride: 102 mEq/L (ref 96–112)
GFR calc Af Amer: >90 mL/min (ref >90)
GFR calc non Af Amer: >90 mL/min (ref >90)
GLUCOSE: 171 mg/dL — AB (ref 70–99)
Potassium: 3.9 mEq/L (ref 3.7–5.3)
Sodium: 140 mEq/L (ref 137–147)
TOTAL PROTEIN: 7.8 g/dL (ref 6.0–8.3)
Total Bilirubin: 0.4 mg/dL (ref 0.3–1.2)

## 2014-09-24 LAB — CBC WITH DIFFERENTIAL/PLATELET
BASOS PCT: 0 % (ref 0–1)
Basophils Absolute: 0 10*3/uL (ref 0.0–0.1)
EOS ABS: 0.2 10*3/uL (ref 0.0–0.7)
EOS PCT: 2 % (ref 0–5)
HEMATOCRIT: 39.3 % (ref 36.0–46.0)
HEMOGLOBIN: 12.4 g/dL (ref 12.0–15.0)
LYMPHS ABS: 3.1 10*3/uL (ref 0.7–4.0)
Lymphocytes Relative: 27 % (ref 12–46)
MCH: 23.3 pg — AB (ref 26.0–34.0)
MCHC: 31.6 g/dL (ref 30.0–36.0)
MCV: 73.7 fL — AB (ref 78.0–100.0)
MONO ABS: 0.5 10*3/uL (ref 0.1–1.0)
Monocytes Relative: 4 % (ref 3–12)
NEUTROS PCT: 67 % (ref 43–77)
Neutro Abs: 7.9 10*3/uL — ABNORMAL HIGH (ref 1.7–7.7)
Platelets: 465 10*3/uL — ABNORMAL HIGH (ref 150–400)
RBC: 5.33 MIL/uL — ABNORMAL HIGH (ref 3.87–5.11)
RDW: 14.4 % (ref 11.5–15.5)
WBC: 11.7 10*3/uL — ABNORMAL HIGH (ref 4.0–10.5)

## 2014-09-24 LAB — CBG MONITORING, ED: Glucose-Capillary: 159 mg/dL — ABNORMAL HIGH (ref 70–99)

## 2014-09-24 LAB — URINE MICROSCOPIC-ADD ON

## 2014-09-24 LAB — PREGNANCY, URINE: PREG TEST UR: NEGATIVE

## 2014-09-24 MED ORDER — ONDANSETRON 4 MG PO TBDP: 4.0000 mg | ORAL_TABLET | Freq: Three times a day (TID) | ORAL | Status: DC | PRN

## 2014-09-24 MED ORDER — SODIUM CHLORIDE 0.9 % IV BOLUS (SEPSIS)
1000.0000 mL | Freq: Once | INTRAVENOUS | Status: AC
  Administered 2014-09-24: 1000 mL via INTRAVENOUS

## 2014-09-24 MED ORDER — ONDANSETRON HCL 4 MG/2ML IJ SOLN
4.0000 mg | Freq: Once | INTRAMUSCULAR | Status: AC
  Administered 2014-09-24: 4 mg via INTRAVENOUS
  Filled 2014-09-24: qty 2

## 2014-09-24 MED ORDER — GI COCKTAIL ~~LOC~~
30.0000 mL | Freq: Once | ORAL | Status: AC
  Administered 2014-09-24: 30 mL via ORAL
  Filled 2014-09-24: qty 30

## 2014-09-24 NOTE — ED Provider Notes (Signed)
CSN: [id number]     Arrival date & time 09/24/14  [id number] History  This chart was scribed for Gerhard Munchobert Kelley Knoth, MD by Gwenyth Oberatherine Macek, ED Scribe. This patient was seen in room APA14/[facility]14 and the patient's care was started at 9:30 AM.    Chief Complaint  Patient presents with  . Emesis   The history is provided by the patient. No language interpreter was used.    HPI Comments: Michelle Tate is a 18 y.o. female with a history of DM and asthma who presents to the Emergency Department complaining of intermittent nausea, vomiting and diarrhea that started yesterday morning. She states generalized weakness, cough, subjective fever and bilateral rib soreness as associated symptoms. Pt took anti-diarrheal medication and cough suppressant, but notes that she did not tolerate treatment. She denies history of abdominal surgeries. Pt also denies lower abdominal pain, syncope and urinary symptoms.   Past Medical History  Diagnosis Date  . Asthma   . Bronchitis   . ADHD (attention deficit hyperactivity disorder)   . Allergy   . Chronic headache   . Hypertension   . Hyperinsulinemia   . Morbid obesity   . Diabetes mellitus without complication   . Irregular bleeding 02/27/2013  . Obesity 02/27/2013   Past Surgical History  Procedure Laterality Date  . Implanon placement    . Tonsillectomy    . Wisdom tooth extraction     Family History  Problem Relation Age of Onset  . Stroke Other   . Cancer Other   . Diabetes Paternal Grandmother   . Hypertension Paternal Grandmother   . Hypertension Maternal Grandmother   . Heart disease Father   . Hypertension Father   . Diabetes Father   . Seizures Mother   . Thyroid disease Cousin    History  Substance Use Topics  . Smoking status: Never Smoker   . Smokeless tobacco: Never Used  . Alcohol Use: No   OB History    No data available     Review of Systems  Constitutional: Positive for fever (subjective).       Per HPI, otherwise negative   HENT:       Per HPI, otherwise negative  Respiratory: Positive for cough.        Per HPI, otherwise negative  Cardiovascular:       Per HPI, otherwise negative  Gastrointestinal: Positive for nausea, vomiting and diarrhea. Negative for abdominal pain.  Endocrine:       Negative aside from HPI  Genitourinary: Negative for dysuria, decreased urine volume and difficulty urinating.       Neg aside from HPI   Musculoskeletal:       Per HPI, otherwise negative  Skin: Negative.   Neurological: Positive for weakness. Negative for syncope.    Allergies  Banana; Peanut-containing drug products; Penicillins; Sulfa drugs cross reactors; and Vanilla  Home Medications   Prior to Admission medications   Medication Sig Start Date End Date Taking? Authorizing Provider  Liraglutide (VICTOZA) 18 MG/3ML SOPN 0.6 mg subcu once a day x 7 days, then 1.2 mg subcu once daily 09/06/14  Yes Campbell Richesarolyn C Hoskins, NP  lisinopril-hydrochlorothiazide (PRINZIDE,ZESTORETIC) 10-12.5 MG per tablet Take 1 tablet by mouth daily. 08/01/14  Yes Campbell Richesarolyn C Hoskins, NP  metFORMIN (GLUCOPHAGE) 500 MG tablet Take 1 tablet (500 mg total) by mouth 2 (two) times daily with a meal. 02/20/14  Yes Campbell Richesarolyn C Hoskins, NP   BP 126/93 mmHg  Pulse 105  Temp(Src) 98.5 F (36.9  C) (Oral)  Resp 23  Ht 5\' 5"  (1.651 m)  Wt 320 lb (145.151 kg)  BMI 53.25 kg/m2  SpO2 99% Physical Exam  Constitutional: She is oriented to person, place, and time. She appears well-developed and well-nourished. No distress.  HENT:  Head: Normocephalic and atraumatic.  Eyes: Conjunctivae and EOM are normal.  Cardiovascular: Normal rate and regular rhythm.   Pulmonary/Chest: Effort normal and breath sounds normal. No stridor. No respiratory distress.  Abdominal: She exhibits no distension. There is tenderness.  Epigastric pain  Musculoskeletal: She exhibits no edema.  Neurological: She is alert and oriented to person, place, and time. No cranial nerve  deficit.  Skin: Skin is warm and dry.  Psychiatric: She has a normal mood and affect.  Nursing note and vitals reviewed.   ED Course  Procedures (including critical care time) DIAGNOSTIC STUDIES: Oxygen Saturation is 99% on RA, normal by my interpretation.    COORDINATION OF CARE: 9:23 AM Discussed treatment plan with pt at bedside and pt agreed to plan.  Labs Review Labs Reviewed  URINALYSIS, ROUTINE W REFLEX MICROSCOPIC - Abnormal; Notable for the following:    Bilirubin Urine SMALL (*)    Protein, ur TRACE (*)    All other components within normal limits  CBC WITH DIFFERENTIAL - Abnormal; Notable for the following:    WBC 11.7 (*)    RBC 5.33 (*)    MCV 73.7 (*)    MCH 23.3 (*)    Platelets 465 (*)    Neutro Abs 7.9 (*)    All other components within normal limits  COMPREHENSIVE METABOLIC PANEL - Abnormal; Notable for the following:    Glucose, Bld 171 (*)    Albumin 3.4 (*)    All other components within normal limits  CBG MONITORING, ED - Abnormal; Notable for the following:    Glucose-Capillary 159 (*)    All other components within normal limits  PREGNANCY, URINE  URINE MICROSCOPIC-ADD ON  PREGNANCY, URINE    11:31 AM Patient awake and alert, states that she feels substantially better. Discussed all findings with the patient and her mother, including return precautions.  MDM  This young female presents with ongoing nausea, vomiting, diarrhea.  She has a soft, non-peritoneal abdomen. Patient is afebrile. There is mild leukocytosis, but this is likely reactive. Patient improved her with fluid resuscitation, antiemetics. With largely reassuring findings, no substantial risk factors for occult infection, patient was discharged in stable condition with her family members.  I personally performed the services described in this documentation, which was scribed in my presence. The recorded information has been reviewed and is accurate.    Gerhard Munchobert Harshal Sirmon,  MD 09/24/14 [phone]

## 2014-09-24 NOTE — Discharge Instructions (Signed)
As discussed, your evaluation today has been largely reassuring.  But, it is important that you monitor your condition carefully, and do not hesitate to return to the ED if you develop new, or concerning changes in your condition. ° °Otherwise, please follow-up with your physician for appropriate ongoing care. ° °Abdominal Pain, Women °Abdominal (stomach, pelvic, or belly) pain can be caused by many things. It is important to tell your doctor: °· The location of the pain. °· Does it come and go or is it present all the time? °· Are there things that start the pain (eating certain foods, exercise)? °· Are there other symptoms associated with the pain (fever, nausea, vomiting, diarrhea)? °All of this is helpful to know when trying to find the cause of the pain. °CAUSES  °· Stomach: virus or bacteria infection, or ulcer. °· Intestine: appendicitis (inflamed appendix), regional ileitis (Crohn's disease), ulcerative colitis (inflamed colon), irritable bowel syndrome, diverticulitis (inflamed diverticulum of the colon), or cancer of the stomach or intestine. °· Gallbladder disease or stones in the gallbladder. °· Kidney disease, kidney stones, or infection. °· Pancreas infection or cancer. °· Fibromyalgia (pain disorder). °· Diseases of the female organs: °¨ Uterus: fibroid (non-cancerous) tumors or infection. °¨ Fallopian tubes: infection or tubal pregnancy. °¨ Ovary: cysts or tumors. °¨ Pelvic adhesions (scar tissue). °¨ Endometriosis (uterus lining tissue growing in the pelvis and on the pelvic organs). °¨ Pelvic congestion syndrome (female organs filling up with blood just before the menstrual period). °¨ Pain with the menstrual period. °¨ Pain with ovulation (producing an egg). °¨ Pain with an IUD (intrauterine device, birth control) in the uterus. °¨ Cancer of the female organs. °· Functional pain (pain not caused by a disease, may improve without treatment). °· Psychological pain. °· Depression. °DIAGNOSIS  °Your  doctor will decide the seriousness of your pain by doing an examination. °· Blood tests. °· X-rays. °· Ultrasound. °· CT scan (computed tomography, special type of X-ray). °· MRI (magnetic resonance imaging). °· Cultures, for infection. °· Barium enema (dye inserted in the large intestine, to better view it with X-rays). °· Colonoscopy (looking in intestine with a lighted tube). °· Laparoscopy (minor surgery, looking in abdomen with a lighted tube). °· Major abdominal exploratory surgery (looking in abdomen with a large incision). °TREATMENT  °The treatment will depend on the cause of the pain.  °· Many cases can be observed and treated at home. °· Over-the-counter medicines recommended by your caregiver. °· Prescription medicine. °· Antibiotics, for infection. °· Birth control pills, for painful periods or for ovulation pain. °· Hormone treatment, for endometriosis. °· Nerve blocking injections. °· Physical therapy. °· Antidepressants. °· Counseling with a psychologist or psychiatrist. °· Minor or major surgery. °HOME CARE INSTRUCTIONS  °· Do not take laxatives, unless directed by your caregiver. °· Take over-the-counter pain medicine only if ordered by your caregiver. Do not take aspirin because it can cause an upset stomach or bleeding. °· Try a clear liquid diet (broth or water) as ordered by your caregiver. Slowly move to a bland diet, as tolerated, if the pain is related to the stomach or intestine. °· Have a thermometer and take your temperature several times a day, and record it. °· Bed rest and sleep, if it helps the pain. °· Avoid sexual intercourse, if it causes pain. °· Avoid stressful situations. °· Keep your follow-up appointments and tests, as your caregiver orders. °· If the pain does not go away with medicine or surgery, you may try: °¨   Acupuncture. °¨ Relaxation exercises (yoga, meditation). °¨ Group therapy. °¨ Counseling. °SEEK MEDICAL CARE IF:  °· You notice certain foods cause stomach  pain. °· Your home care treatment is not helping your pain. °· You need stronger pain medicine. °· You want your IUD removed. °· You feel faint or lightheaded. °· You develop nausea and vomiting. °· You develop a rash. °· You are having side effects or an allergy to your medicine. °SEEK IMMEDIATE MEDICAL CARE IF:  °· Your pain does not go away or gets worse. °· You have a fever. °· Your pain is felt only in portions of the abdomen. The right side could possibly be appendicitis. The left lower portion of the abdomen could be colitis or diverticulitis. °· You are passing blood in your stools (bright red or black tarry stools, with or without vomiting). °· You have blood in your urine. °· You develop chills, with or without a fever. °· You pass out. °MAKE SURE YOU:  °· Understand these instructions. °· Will watch your condition. °· Will get help right away if you are not doing well or get worse. °Document Released: 08/01/2007 Document Revised: 02/18/2014 Document Reviewed: 08/21/2009 °ExitCare® Patient Information ©2015 [employer]. This information is not intended to replace advice given to you by your health care provider. Make sure you discuss any questions you have with your health care provider. ° °

## 2014-09-24 NOTE — ED Notes (Signed)
N/v/d x 1 day. H/o IDDM.

## 2014-10-15 ENCOUNTER — Telehealth: Payer: Self-pay | Admitting: Family Medicine

## 2014-10-15 NOTE — Telephone Encounter (Signed)
Liraglutide (VICTOZA) 18 MG/3ML SOPN   Pt states she needs a refill on this med and that it seems to be working well  For her at this point  [facility]   Last filled 09/06/14

## 2014-10-15 NOTE — Telephone Encounter (Signed)
Pt calling back to check on this, she states she does not have any more left

## 2014-10-17 ENCOUNTER — Other Ambulatory Visit: Payer: Self-pay | Admitting: Nurse Practitioner

## 2014-10-17 MED ORDER — LIRAGLUTIDE 18 MG/3ML ~~LOC~~ SOPN
PEN_INJECTOR | SUBCUTANEOUS | Status: DC
Start: 2014-10-17 — End: 2015-05-20

## 2014-10-17 NOTE — Telephone Encounter (Signed)
Patient has already called pharmacy and got her refill.

## 2014-10-17 NOTE — Telephone Encounter (Signed)
I gave her 5 refills with first Rx, but went ahead and reordered.

## 2014-10-22 ENCOUNTER — Ambulatory Visit: Payer: 59

## 2014-10-29 ENCOUNTER — Ambulatory Visit (INDEPENDENT_AMBULATORY_CARE_PROVIDER_SITE_OTHER): Payer: 59

## 2014-10-29 DIAGNOSIS — Z3049 Encounter for surveillance of other contraceptives: Secondary | ICD-10-CM

## 2014-10-29 DIAGNOSIS — Z3042 Encounter for surveillance of injectable contraceptive: Secondary | ICD-10-CM

## 2014-10-29 MED ORDER — MEDROXYPROGESTERONE ACETATE 150 MG/ML IM SUSP
150.0000 mg | Freq: Once | INTRAMUSCULAR | Status: AC
  Administered 2014-10-29: 150 mg via INTRAMUSCULAR

## 2014-12-02 ENCOUNTER — Ambulatory Visit: Payer: 59 | Admitting: Nurse Practitioner

## 2014-12-30 ENCOUNTER — Ambulatory Visit (INDEPENDENT_AMBULATORY_CARE_PROVIDER_SITE_OTHER): Payer: 59 | Admitting: Family Medicine

## 2014-12-30 ENCOUNTER — Encounter: Payer: Self-pay | Admitting: Family Medicine

## 2014-12-30 VITALS — Temp 98.6°F | Ht 66.0 in | Wt 369.0 lb

## 2014-12-30 DIAGNOSIS — J019 Acute sinusitis, unspecified: Secondary | ICD-10-CM | POA: Diagnosis not present

## 2014-12-30 DIAGNOSIS — B9689 Other specified bacterial agents as the cause of diseases classified elsewhere: Secondary | ICD-10-CM

## 2014-12-30 MED ORDER — DOXYCYCLINE HYCLATE 100 MG PO CAPS: 100.0000 mg | ORAL_CAPSULE | Freq: Two times a day (BID) | ORAL | Status: DC

## 2014-12-30 NOTE — Progress Notes (Signed)
   Subjective:    Patient ID: Michelle Tate, female    DOB: 1995/10/31, 19 y.o.   MRN: [id number]  Cough Associated symptoms include a fever, headaches, nasal congestion, rhinorrhea, a sore throat and wheezing. Pertinent negatives include no chest pain, ear pain or shortness of breath. Treatments tried: thera flu.    PMH asthma Patient relates a lot of sinus pressure congestion pressure and pain  Review of Systems  Constitutional: Positive for fever. Negative for activity change.  HENT: Positive for congestion, rhinorrhea and sore throat. Negative for ear pain.   Eyes: Negative for discharge.  Respiratory: Positive for cough and wheezing. Negative for shortness of breath.   Cardiovascular: Negative for chest pain.  Neurological: Positive for headaches.       Objective:   Physical Exam  Constitutional: She appears well-developed.  HENT:  Head: Normocephalic.  Nose: Nose normal.  Mouth/Throat: Oropharynx is clear and moist. No oropharyngeal exudate.  Neck: Neck supple.  Cardiovascular: Normal rate and normal heart sounds.   No murmur heard. Pulmonary/Chest: Effort normal and breath sounds normal. She has no wheezes.  Lymphadenopathy:    She has no cervical adenopathy.  Skin: Skin is warm and dry.  Nursing note and vitals reviewed.         Assessment & Plan:  Viral URI Secondary sinusitis Reactive airway No sign of flu or pneumonia Warning signs discuss Antibiotics prescribed Follow-up if problems

## 2015-01-21 ENCOUNTER — Ambulatory Visit: Payer: 59

## 2015-01-25 ENCOUNTER — Encounter (HOSPITAL_COMMUNITY): Payer: Self-pay | Admitting: *Deleted

## 2015-01-25 ENCOUNTER — Emergency Department (HOSPITAL_COMMUNITY)
Admission: EM | Admit: 2015-01-25 | Discharge: 2015-01-25 | Disposition: A | Discharge status: Home / Self Care | Payer: 59 | Attending: Emergency Medicine | Admitting: Emergency Medicine

## 2015-01-25 DIAGNOSIS — J45909 Unspecified asthma, uncomplicated: Secondary | ICD-10-CM | POA: Diagnosis not present

## 2015-01-25 DIAGNOSIS — Z8659 Personal history of other mental and behavioral disorders: Secondary | ICD-10-CM | POA: Diagnosis not present

## 2015-01-25 DIAGNOSIS — Z3202 Encounter for pregnancy test, result negative: Secondary | ICD-10-CM | POA: Insufficient documentation

## 2015-01-25 DIAGNOSIS — E1165 Type 2 diabetes mellitus with hyperglycemia: Secondary | ICD-10-CM | POA: Diagnosis not present

## 2015-01-25 DIAGNOSIS — I10 Essential (primary) hypertension: Secondary | ICD-10-CM | POA: Insufficient documentation

## 2015-01-25 DIAGNOSIS — N946 Dysmenorrhea, unspecified: Secondary | ICD-10-CM

## 2015-01-25 DIAGNOSIS — Z79899 Other long term (current) drug therapy: Secondary | ICD-10-CM | POA: Diagnosis not present

## 2015-01-25 DIAGNOSIS — Z880 Allergy status to penicillin: Secondary | ICD-10-CM | POA: Diagnosis not present

## 2015-01-25 DIAGNOSIS — R739 Hyperglycemia, unspecified: Secondary | ICD-10-CM

## 2015-01-25 DIAGNOSIS — R1031 Right lower quadrant pain: Secondary | ICD-10-CM | POA: Diagnosis present

## 2015-01-25 LAB — CBC WITH DIFFERENTIAL/PLATELET
BASOS ABS: 0 10*3/uL (ref 0.0–0.1)
Basophils Relative: 0 % (ref 0–1)
Eosinophils Absolute: 0.1 10*3/uL (ref 0.0–0.7)
Eosinophils Relative: 1 % (ref 0–5)
HCT: 39.6 % (ref 36.0–46.0)
HEMOGLOBIN: 12.6 g/dL (ref 12.0–15.0)
Lymphocytes Relative: 35 % (ref 12–46)
Lymphs Abs: 3.7 10*3/uL (ref 0.7–4.0)
MCH: 24 pg — AB (ref 26.0–34.0)
MCHC: 31.8 g/dL (ref 30.0–36.0)
MCV: 75.3 fL — AB (ref 78.0–100.0)
MONOS PCT: 4 % (ref 3–12)
Monocytes Absolute: 0.5 10*3/uL (ref 0.1–1.0)
Neutro Abs: 6.3 10*3/uL (ref 1.7–7.7)
Neutrophils Relative %: 60 % (ref 43–77)
PLATELETS: 376 10*3/uL (ref 150–400)
RBC: 5.26 MIL/uL — AB (ref 3.87–5.11)
RDW: 14.9 % (ref 11.5–15.5)
WBC: 10.6 10*3/uL — AB (ref 4.0–10.5)

## 2015-01-25 LAB — URINALYSIS, ROUTINE W REFLEX MICROSCOPIC
Bilirubin Urine: NEGATIVE
Glucose, UA: >1000 mg/dL — AB
Ketones, ur: NEGATIVE mg/dL
LEUKOCYTES UA: NEGATIVE
Nitrite: NEGATIVE
Specific Gravity, Urine: 1.025 (ref 1.005–1.030)
Urobilinogen, UA: 0.2 mg/dL (ref 0.0–1.0)
pH: 5.5 (ref 5.0–8.0)

## 2015-01-25 LAB — URINE MICROSCOPIC-ADD ON

## 2015-01-25 LAB — BASIC METABOLIC PANEL
ANION GAP: 9 (ref 5–15)
BUN: 12 mg/dL (ref 6–23)
CO2: 25 mmol/L (ref 19–32)
Calcium: 9.1 mg/dL (ref 8.4–10.5)
Chloride: 101 mmol/L (ref 96–112)
Creatinine, Ser: 0.81 mg/dL (ref 0.50–1.10)
GFR calc Af Amer: >90 mL/min (ref >90)
GFR calc non Af Amer: >90 mL/min (ref >90)
GLUCOSE: 408 mg/dL — AB (ref 70–99)
POTASSIUM: 4.1 mmol/L (ref 3.5–5.1)
SODIUM: 135 mmol/L (ref 135–145)

## 2015-01-25 LAB — CBG MONITORING, ED: GLUCOSE-CAPILLARY: 322 mg/dL — AB (ref 70–99)

## 2015-01-25 LAB — POC URINE PREG, ED: Preg Test, Ur: NEGATIVE

## 2015-01-25 MED ORDER — NAPROXEN 500 MG PO TABS: 500.0000 mg | ORAL_TABLET | Freq: Two times a day (BID) | ORAL | Status: DC

## 2015-01-25 MED ORDER — KETOROLAC TROMETHAMINE 60 MG/2ML IM SOLN
60.0000 mg | Freq: Once | INTRAMUSCULAR | Status: AC
Start: 2015-01-25 — End: 2015-01-25
  Administered 2015-01-25: 60 mg via INTRAMUSCULAR
  Filled 2015-01-25: qty 2

## 2015-01-25 MED ORDER — INSULIN ASPART 100 UNIT/ML ~~LOC~~ SOLN
15.0000 [IU] | Freq: Once | SUBCUTANEOUS | Status: AC
  Administered 2015-01-25: 15 [IU] via SUBCUTANEOUS
  Filled 2015-01-25: qty 1

## 2015-01-25 NOTE — ED Notes (Signed)
Hope notified of CBG.

## 2015-01-25 NOTE — ED Notes (Signed)
Pt is having her 1st period since quitting the depo shot. Pt is now experiencing abdominal cramps.

## 2015-01-25 NOTE — Discharge Instructions (Signed)
Be sure to keep check on your blood sugar and watch your diet. Follow up with your provider in Dr. Cathlyn ParsonsLukings office to discuss further evaluation of your blood sugar.   Dysmenorrhea Dysmenorrhea is pain during a menstrual period. You will have pain in the lower belly (abdomen). The pain is caused by the tightening (contracting) of the muscles of the uterus. The pain can be minor or severe. Headache, feeling sick to your stomach (nausea), throwing up (vomiting), or low back pain may occur with this condition. HOME CARE  Only take medicine as told by your doctor.  Place a heating pad or hot water bottle on your lower back or belly. Do not sleep with a heating pad.  Exercise may help lessen the pain.  Massage the lower back or belly.  Stop smoking.  Avoid alcohol and caffeine. GET HELP IF:   Your pain does not get better with medicine.  You have pain during sex.  Your pain gets worse while taking pain medicine.  Your period bleeding is heavier than normal.  You keep feeling sick to your stomach or keep throwing up. GET HELP RIGHT AWAY IF: You pass out (faint). Document Released: 12/31/2008 Document Revised: 10/09/2013 Document Reviewed: 03/22/2013 [facility] Patient Information 2015 [place], [place]. This information is not intended to replace advice given to you by your health care provider. Make sure you discuss any questions you have with your health care provider.  High Blood Sugar High blood sugar (hyperglycemia) means that the level of sugar in your blood is higher than it should be. Signs of high blood sugar include:  Feeling thirsty.  Frequent peeing (urinating).  Feeling tired or sleepy.  Dry mouth.  Vision changes.  Feeling weak.  Feeling hungry but losing weight.  Numbness and tingling in your hands or feet.  Headache. When you ignore these signs, your blood sugar may keep going up. These problems may get worse, and other problems may begin. HOME CARE  Check  your blood sugars as told by your doctor. Write down the numbers with the date and time.  Take the right amount of insulin or diabetes pills at the right time. Write down the dose with date and time.  Refill your insulin or diabetes pills before running out.  Watch what you eat. Follow your meal plan.  Drink liquids without sugar, such as water. Check with your doctor if you have kidney or heart disease.  Follow your doctor's orders for exercise. Exercise at the same time of day.  Keep your doctor's appointments. GET HELP RIGHT AWAY IF:   You have trouble thinking or are confused.  You have fast breathing with fruity smelling breath.  You pass out (faint).  You have 2 to 3 days of high blood sugars and you do not know why.  You have chest pain.  You are feeling sick to your stomach (nauseous) or throwing up (vomiting).  You have sudden vision changes. MAKE SURE YOU:   Understand these instructions.  Will watch your condition.  Will get help right away if you are not doing well or get worse. Document Released: 08/01/2009 Document Revised: 12/27/2011 Document Reviewed: 08/01/2009 [facility]Exit[facility] Patient Information 2015 [place], [place]. This information is not intended to replace advice given to you by your health care provider. Make sure you discuss any questions you have with your health care provider.

## 2015-01-25 NOTE — ED Provider Notes (Signed)
CSN: [id number]     Arrival date & time 01/25/15  1932 History   First MD Initiated Contact with Patient 01/25/15 1949     No chief complaint on file.    (Consider location/radiation/quality/duration/timing/severity/associated sxs/prior Treatment) Patient is a 19 y.o. female presenting with abdominal pain. The history is provided by the patient.  Abdominal Pain Pain location:  RLQ and LLQ Pain quality: cramping   Pain radiates to:  Does not radiate Pain severity:  Moderate Onset quality:  Gradual Associated symptoms: vaginal bleeding    Abe Peoplelecia N Vargus is a 19 y.o. female who presents to the ED with lower abdominal pain. She states that she has been using Depo Provera for birth control for a year and stopped getting the shot. This is the first period she has had sine going off the shot and is experiencing bad cramping. She had period cramps in the past that were similar to this. She has never been pregnant. Never had a pap smear. She receives [facility] Care [facility]. Patient is sexually active, has never had an STD, current partner is female and she has been with him for 4 years. She is not concerned about STD's and does not feel the need for screening at this time.   Past Medical History  Diagnosis Date  . Asthma   . Bronchitis   . ADHD (attention deficit hyperactivity disorder)   . Allergy   . Chronic headache   . Hypertension   . Hyperinsulinemia   . Morbid obesity   . Diabetes mellitus without complication   . Irregular bleeding 02/27/2013  . Obesity 02/27/2013   Past Surgical History  Procedure Laterality Date  . Implanon placement    . Tonsillectomy    . Wisdom tooth extraction     Family History  Problem Relation Age of Onset  . Stroke Other   . Cancer Other   . Diabetes Paternal Grandmother   . Hypertension Paternal Grandmother   . Hypertension Maternal Grandmother   . Heart disease Father   . Hypertension Father   . Diabetes Father   . Seizures  Mother   . Thyroid disease Cousin    History  Substance Use Topics  . Smoking status: Never Smoker   . Smokeless tobacco: Never Used  . Alcohol Use: No   OB History    No data available     Review of Systems  Gastrointestinal: Positive for abdominal pain.  Genitourinary: Positive for vaginal bleeding.  all other systems negative    Allergies  Banana; Peanut-containing drug products; Penicillins; Sulfa drugs cross reactors; and Vanilla  Home Medications   Prior to Admission medications   Medication Sig Start Date End Date Taking? Authorizing Provider  acetaminophen (TYLENOL) 325 MG tablet Take 325 mg by mouth every 6 (six) hours as needed.   Yes Historical Provider, MD  albuterol (PROVENTIL HFA;VENTOLIN HFA) 108 (90 BASE) MCG/ACT inhaler Inhale 2 puffs into the lungs every 6 (six) hours as needed for wheezing or shortness of breath.   Yes Historical Provider, MD  BIOTIN PO Take 1 tablet by mouth daily.   Yes Historical Provider, MD  Liraglutide (VICTOZA) 18 MG/3ML SOPN 1.2 mg subcu once daily 10/17/14  Yes Campbell Richesarolyn C Hoskins, NP  lisinopril-hydrochlorothiazide (PRINZIDE,ZESTORETIC) 10-12.5 MG per tablet Take 1 tablet by mouth daily. 08/01/14  Yes Campbell Richesarolyn C Hoskins, NP  medroxyPROGESTERone (DEPO-PROVERA) 150 MG/ML injection Inject 150 mg into the muscle every 3 (three) months.   Yes Historical Provider,  MD  metFORMIN (GLUCOPHAGE) 500 MG tablet Take 1 tablet (500 mg total) by mouth 2 (two) times daily with a meal. 02/20/14  Yes Campbell Riches, NP  naproxen (NAPROSYN) 500 MG tablet Take 1 tablet (500 mg total) by mouth 2 (two) times daily. 01/25/15   Hope Orlene Och, NP   BP 127/63 mmHg  Pulse 106  Temp(Src) 98.4 F (36.9 C)  Resp 16  SpO2 100%  LMP 01/24/2015 Physical Exam  Constitutional: She is oriented to person, place, and time. No distress.  Morbidly obese  HENT:  Head: Normocephalic.  Eyes: Conjunctivae and EOM are normal.  Neck: Normal range of motion. Neck supple.    Cardiovascular: Normal rate.   Pulmonary/Chest: Effort normal. No respiratory distress.  Abdominal: Soft. There is tenderness in the right lower quadrant, suprapubic area and left lower quadrant. There is no rebound, no guarding and no CVA tenderness.    Morbidly obese  Genitourinary:  External genitalia without lesions, small blood vaginal vault, mild CMT, bilateral adnexal tenderness that is mild. Unable to palpate uterus due to patient habitus.   Musculoskeletal: Normal range of motion.  Neurological: She is alert and oriented to person, place, and time. No cranial nerve deficit.  Skin: Skin is warm and dry.  Psychiatric: She has a normal mood and affect. Her behavior is normal.  Nursing note and vitals reviewed.   ED Course  Procedures (including critical care time) Labs Review Results for orders placed or performed during the hospital encounter of 01/25/15 (from the past 24 hour(s))  Urinalysis, Routine w reflex microscopic     Status: Abnormal   Collection Time: 01/25/15  7:55 PM  Result Value Ref Range   Color, Urine YELLOW YELLOW   APPearance HAZY (A) CLEAR   Specific Gravity, Urine 1.025 1.005 - 1.030   pH 5.5 5.0 - 8.0   Glucose, UA >1000 (A) NEGATIVE mg/dL   Hgb urine dipstick LARGE (A) NEGATIVE   Bilirubin Urine NEGATIVE NEGATIVE   Ketones, ur NEGATIVE NEGATIVE mg/dL   Protein, ur TRACE (A) NEGATIVE mg/dL   Urobilinogen, UA 0.2 0.0 - 1.0 mg/dL   Nitrite NEGATIVE NEGATIVE   Leukocytes, UA NEGATIVE NEGATIVE  Urine microscopic-add on     Status: Abnormal   Collection Time: 01/25/15  7:55 PM  Result Value Ref Range   Squamous Epithelial / LPF FEW (A) RARE   WBC, UA 3-6 <3 WBC/hpf   RBC / HPF TOO NUMEROUS TO COUNT <3 RBC/hpf   Bacteria, UA FEW (A) RARE  POC urine preg, ED (not at [facility])     Status: None   Collection Time: 01/25/15  7:59 PM  Result Value Ref Range   Preg Test, Ur NEGATIVE NEGATIVE  CBC with Differential/Platelet     Status: Abnormal   Collection  Time: 01/25/15  9:41 PM  Result Value Ref Range   WBC 10.6 (H) 4.0 - 10.5 K/uL   RBC 5.26 (H) 3.87 - 5.11 MIL/uL   Hemoglobin 12.6 12.0 - 15.0 g/dL   HCT 16.1 09.6 - 04.5 %   MCV 75.3 (L) 78.0 - 100.0 fL   MCH 24.0 (L) 26.0 - 34.0 pg   MCHC 31.8 30.0 - 36.0 g/dL   RDW 40.9 81.1 - 91.4 %   Platelets 376 150 - 400 K/uL   Neutrophils Relative % 60 43 - 77 %   Neutro Abs 6.3 1.7 - 7.7 K/uL   Lymphocytes Relative 35 12 - 46 %   Lymphs Abs 3.7 0.7 -  4.0 K/uL   Monocytes Relative 4 3 - 12 %   Monocytes Absolute 0.5 0.1 - 1.0 K/uL   Eosinophils Relative 1 0 - 5 %   Eosinophils Absolute 0.1 0.0 - 0.7 K/uL   Basophils Relative 0 0 - 1 %   Basophils Absolute 0.0 0.0 - 0.1 K/uL  Basic metabolic panel     Status: Abnormal   Collection Time: 01/25/15  9:41 PM  Result Value Ref Range   Sodium 135 135 - 145 mmol/L   Potassium 4.1 3.5 - 5.1 mmol/L   Chloride 101 96 - 112 mmol/L   CO2 25 19 - 32 mmol/L   Glucose, Bld 408 (H) 70 - 99 mg/dL   BUN 12 6 - 23 mg/dL   Creatinine, Ser 2.44 0.50 - 1.10 mg/dL   Calcium 9.1 8.4 - 01.0 mg/dL   GFR calc non Af Amer >90 >90 mL/min   GFR calc Af Amer >90 >90 mL/min   Anion gap 9 5 - 15  CBG monitoring, ED     Status: Abnormal   Collection Time: 01/25/15 11:09 PM  Result Value Ref Range   Glucose-Capillary 322 (H) 70 - 99 mg/dL  Toradol 60 mg IM   Discussed with the patient elevated blood glucose and she states that prior to coming to the hospital she went to a birthday party and had cake, ice cream and meatballs.   Patient given insulin 15 units SQ.  @ 2310 patient reassessed and CBG is 311, patient sates she feels fine and wants to go home.   MDM  19 y.o. female with lower abdominal cramping with menses. Pain improved with Toradol. Stable for d/c without pain and improved CBG after insulin. Discussed with the patient clinical and lab findings and plan of care. All questioned fully answered. She will follow up with her PCP or return here if any  problems arise.   Final diagnoses:  Dysmenorrhea  Hyperglycemia       Janne Napoleon, NP 01/25/15 2314  Gilda Crease, MD 01/25/15 [id number]

## 2015-03-06 ENCOUNTER — Encounter: Payer: Self-pay | Admitting: Nurse Practitioner

## 2015-03-06 ENCOUNTER — Ambulatory Visit (INDEPENDENT_AMBULATORY_CARE_PROVIDER_SITE_OTHER): Payer: 59 | Admitting: Nurse Practitioner

## 2015-03-06 VITALS — BP 138/82 | Ht 64.0 in | Wt 369.4 lb

## 2015-03-06 DIAGNOSIS — Z3042 Encounter for surveillance of injectable contraceptive: Secondary | ICD-10-CM | POA: Diagnosis not present

## 2015-03-06 DIAGNOSIS — N939 Abnormal uterine and vaginal bleeding, unspecified: Secondary | ICD-10-CM

## 2015-03-06 LAB — POCT HEMOGLOBIN: Hemoglobin: 12.9 g/dL (ref 12.2–16.2)

## 2015-03-06 LAB — POCT URINE PREGNANCY: Preg Test, Ur: NEGATIVE

## 2015-03-06 MED ORDER — MEDROXYPROGESTERONE ACETATE 150 MG/ML IM SUSP
150.0000 mg | Freq: Once | INTRAMUSCULAR | Status: AC
  Administered 2015-03-06: 150 mg via INTRAMUSCULAR

## 2015-03-06 NOTE — Progress Notes (Signed)
Subjective:  Presents for recheck. Missed her last step a shot about 3 months ago due to lack of time. Has had bleeding every day heavy at times. A pack of thick pads will last her about 2 weeks. Frequent cramping. Same sexual partner. Would like to restart Depo-Provera.  Objective:   BP 138/82 mmHg  Ht 5\' 4"  (1.626 m)  Wt 369 lb 6.4 oz (167.559 kg)  BMI 63.38 kg/m2  LMP 12/31/2014 NAD. Alert, oriented. Lungs clear. Heart regular rate rhythm. Results for orders placed or performed in visit on 03/06/15  POCT hemoglobin  Result Value Ref Range   Hemoglobin 12.9 12.2 - 16.2 g/dL  POCT urine pregnancy  Result Value Ref Range   Preg Test, Ur Negative      Assessment: Abnormal uterine bleeding - Plan: POCT hemoglobin  Encounter for surveillance of injectable contraceptive - Plan: POCT urine pregnancy, medroxyPROGESTERone (DEPO-PROVERA) injection 150 mg  Plan:  Meds ordered this encounter  Medications  . medroxyPROGESTERone (DEPO-PROVERA) injection 150 mg    Sig:    Restart Depo-Provera as directed. Call back in 7 days if no significant improvement in bleeding, sooner if worse. Routine follow-up for other health problems.

## 2015-03-31 ENCOUNTER — Ambulatory Visit (INDEPENDENT_AMBULATORY_CARE_PROVIDER_SITE_OTHER): Payer: 59 | Admitting: Women's Health

## 2015-03-31 ENCOUNTER — Encounter: Payer: Self-pay | Admitting: Women's Health

## 2015-03-31 VITALS — BP 140/100 | HR 111 | Ht 65.0 in | Wt 364.0 lb

## 2015-03-31 DIAGNOSIS — N9089 Other specified noninflammatory disorders of vulva and perineum: Secondary | ICD-10-CM | POA: Insufficient documentation

## 2015-03-31 HISTORY — DX: Other specified noninflammatory disorders of vulva and perineum: N90.89

## 2015-03-31 LAB — POCT WET PREP (WET MOUNT): CLUE CELLS WET PREP WHIFF POC: NEGATIVE

## 2015-03-31 MED ORDER — NYSTATIN-TRIAMCINOLONE 100000-0.1 UNIT/GM-% EX OINT: 1.0000 "application " | TOPICAL_OINTMENT | Freq: Two times a day (BID) | CUTANEOUS | Status: DC | PRN

## 2015-03-31 MED ORDER — FLUCONAZOLE 100 MG PO TABS
100.0000 mg | ORAL_TABLET | Freq: Every day | ORAL | Status: DC
Start: 2015-03-31 — End: 2015-04-15

## 2015-03-31 NOTE — Progress Notes (Signed)
Patient ID: [name], female   DOB: [birth year], 19 y.o.   MRN: [id number]   [facility] Visit  Patient name: Michelle Tate MRN [id number]  Date of birth: 11-09-1995  CC & HPI:  Michelle Tate is a 19 y.o. African American female presenting today for report of vulvar itching/irritation since period went off on March 15. On depo. No abnormal d/c, odor. Hasn't had pap since turning 19yo. Has Lisinopril/HCTZ listed on med list rx in Oct 2015 by PCP [facility], pt states she's unaware of this rx. BPs have been normal at PCP visits since Oct until today- states she is in pain and nervous today. Unsure of last A1C, fastings in 150s.   Pertinent History Reviewed:  Medical & Surgical Hx:   Past Medical History  Diagnosis Date  . Asthma   . Bronchitis   . ADHD (attention deficit hyperactivity disorder)   . Allergy   . Chronic headache   . Hypertension   . Hyperinsulinemia   . Morbid obesity   . Diabetes mellitus without complication   . Irregular bleeding 02/27/2013  . Obesity 02/27/2013   Past Surgical History  Procedure Laterality Date  . Implanon placement    . Tonsillectomy    . Wisdom tooth extraction     Medications: Reviewed & Updated - see associated section Social History: Reviewed -  reports that she has never smoked. She has never used smokeless tobacco.  Objective Findings:  Vitals: BP 140/100 mmHg  Pulse 111  Ht 5\' 5"  (1.651 m)  Wt 364 lb (165.109 kg)  BMI 60.57 kg/m2  LMP 12/31/2014  Physical Examination: General appearance - alert, well appearing, and in no distress Pelvic - external vulva erythematous, excoriated, tender, small amount creamy white nonodorous d/c at introitus Co-exam w/ LHE- intravaginal and vulvar areas painted w/ gentian violet  Results for orders placed or performed in visit on 03/31/15 (from the past 24 hour(s))  POCT Wet Prep Mellody [place])   Collection Time: 03/31/15  5:07 PM  Result Value Ref Range   Source Wet Prep  POC vaginal    WBC, Wet Prep HPF POC none    Bacteria Wet Prep HPF POC None (A) Few   BACTERIA WET PREP MORPHOLOGY POC     Clue Cells Wet Prep HPF POC None None   Clue Cells Wet Prep Whiff POC Negative Whiff    Yeast Wet Prep HPF POC None    KOH Wet Prep POC     Trichomonas Wet Prep HPF POC none      Assessment & Plan:  A:   Vulvar irritation/excoriation likely from yeast  DM  HTN P:  Vulva painted w/ gentian violet  Per LHE: rx diflucan 100mg  daily x 1 month, mytrex prn   F/U 2wks for f/u and pap & physical- will also get fasting labs/A1C at that time   Marge Duncans CNM, [facility] 03/31/2015 5:08 PM

## 2015-04-02 ENCOUNTER — Telehealth: Payer: Self-pay | Admitting: Women's Health

## 2015-04-02 NOTE — Telephone Encounter (Signed)
Pt states saw Joellyn Haff, CNM on 03/31/2015 treated for yeast infection. Pt states she is not any better, still having the irritation and excoriation, unable to walk without discomfort. Pt is requesting a note to be excused from work today.

## 2015-04-02 NOTE — Telephone Encounter (Signed)
Pt informed letter at front desk for pick up (close at 5 pm today) and prior auth completed for Diflucan. Pt verbalized understanding.

## 2015-04-15 ENCOUNTER — Ambulatory Visit (INDEPENDENT_AMBULATORY_CARE_PROVIDER_SITE_OTHER): Payer: 59 | Admitting: Women's Health

## 2015-04-15 ENCOUNTER — Encounter: Payer: Self-pay | Admitting: Women's Health

## 2015-04-15 ENCOUNTER — Other Ambulatory Visit (HOSPITAL_COMMUNITY)
Admission: RE | Admit: 2015-04-15 | Discharge: 2015-04-15 | Disposition: A | Discharge status: Home / Self Care | Payer: 59 | Source: Ambulatory Visit | Attending: Obstetrics & Gynecology | Admitting: Obstetrics & Gynecology

## 2015-04-15 VITALS — BP 132/86 | Ht 65.0 in | Wt 361.0 lb

## 2015-04-15 DIAGNOSIS — Z01419 Encounter for gynecological examination (general) (routine) without abnormal findings: Secondary | ICD-10-CM | POA: Insufficient documentation

## 2015-04-15 DIAGNOSIS — E118 Type 2 diabetes mellitus with unspecified complications: Secondary | ICD-10-CM

## 2015-04-15 DIAGNOSIS — Z113 Encounter for screening for infections with a predominantly sexual mode of transmission: Secondary | ICD-10-CM | POA: Insufficient documentation

## 2015-04-15 NOTE — Progress Notes (Signed)
Patient ID: Michelle Tate, female   DOB: 1996/06/15, 19 y.o.   MRN: [id number] Subjective:   SEMIAH Tate is a 19 y.o. G0P0 African American female here for a routine well-woman exam.  No LMP recorded. Patient has had an injection.    Current complaints: none. Excoriated vulva much better! Was finally able to get diflucan.  PCP: Sherie Don, NP       Does desire labs, does not want STI screening other than gc/ct  Type II DM dx ~59yrs ago: Currently on metformin  BID and Victoza 1.2mg  daily. Last A1C 10.2 on 08/02/14, 11.7 prior to that. Checks sugars QID- fasting and before q meal. Fasting this am 95. Most sugars are elevated. Reports she has never had diabetic education from diabetes educator. Still eating lots of carbs/sweets. Drinks water w/ flavor packets, Kool-Aid, and unsweet tea. Doesn't exercise, but states she is planning on starting. Last DM check-up w/ PCP 09/11/14.   Social History: Sexual: heterosexual Marital Status: dating same partner x 51yrs Living situation: w/ mother Occupation: [employer], Conservation officer, nature Tobacco/alcohol: no tobacco, etoh: social Illicit drugs: no history of illicit drug use  The following portions of the patient's history were reviewed and updated as appropriate: allergies, current medications, past family history, past medical history, past social history, past surgical history and problem list.  Past Medical History Past Medical History  Diagnosis Date  . Asthma   . Bronchitis   . ADHD (attention deficit hyperactivity disorder)   . Allergy   . Chronic headache   . Hypertension   . Hyperinsulinemia   . Morbid obesity   . Diabetes mellitus without complication   . Irregular bleeding 02/27/2013  . Obesity 02/27/2013    Past Surgical History Past Surgical History  Procedure Laterality Date  . Implanon placement    . Tonsillectomy    . Wisdom tooth extraction      Gynecologic History No obstetric history on file.  No LMP recorded. Patient  has had an injection. Contraception: depo since 08/02/14- pt states she has gained 'a lot of weight since starting'. Switched from [employer] and nexplanon d/t DUB.  Last Pap: never. Results were: n/a Last mammogram: never. Results were: n/a Last TCS: never  Obstetric History OB History  No data available    Current Medications Current Outpatient Prescriptions on File Prior to Visit  Medication Sig Dispense Refill  . acetaminophen (TYLENOL) 325 MG tablet Take 325 mg by mouth every 6 (six) hours as needed.    Marland Kitchen albuterol (PROVENTIL HFA;VENTOLIN HFA) 108 (90 BASE) MCG/ACT inhaler Inhale 2 puffs into the lungs every 6 (six) hours as needed for wheezing or shortness of breath.    Marland Kitchen BIOTIN PO Take 1 tablet by mouth daily.    . Liraglutide (VICTOZA) 18 MG/3ML SOPN 1.2 mg subcu once daily 2 pen 5  . medroxyPROGESTERone (DEPO-PROVERA) 150 MG/ML injection Inject 150 mg into the muscle every 3 (three) months.    . metFORMIN (GLUCOPHAGE) 500 MG tablet Take 1 tablet (500 mg total) by mouth 2 (two) times daily with a meal. 60 tablet 5  . naproxen (NAPROSYN) 500 MG tablet Take 1 tablet (500 mg total) by mouth 2 (two) times daily. 20 tablet 0   No current facility-administered medications on file prior to visit.    Review of Systems Patient denies any headaches, blurred vision, shortness of breath, chest pain, abdominal pain, problems with bowel movements, urination, or intercourse.  Objective:  BP 132/86 mmHg  Ht  (1.651  m)  Wt 361 lb (163.749 kg)  BMI 60.07 kg/m2 Physical Exam  General:  Well developed, well nourished, no acute distress. She is alert and oriented x3. Skin:  Warm and dry Neck:  Midline trachea, no thyromegaly or nodules Cardiovascular: Regular rate and rhythm, no murmur heard Lungs:  Effort normal, all lung fields clear to auscultation bilaterally Breasts:  No dominant palpable mass, retraction, or nipple discharge Abdomen:  Soft, non tender, no hepatosplenomegaly or  masses Pelvic:  External genitalia is normal in appearance- much improved from last visit, no longer excoriated.  The vagina is normal in appearance. The cervix is bulbous and slightly friable, no CMT.  Thin prep pap is done w/ reflex HR HPV cotesting. Unable to adequately palpate uterus d/t body habitus- no tenderness noted. No adnexal masses or tenderness noted, although exam suboptimal d/t body habitus. Extremities:  No swelling or varicosities noted Psych:  She has a normal mood and affect  Assessment:   Healthy well-woman exam Type II DM Morbid obesity Markedly improved vulvovaginal candida/excoriation  Plan:  GC/CT from pap, CBC, CMP, TSH, fasting Lipid panel, A1C today Comprehensive education from diabetes educator referral placed today- pt to expect call from them to schedule appt Strongly encouraged weight loss, to begin decreasing carbs, cut out Kool-Aid, begin exercising slowly (walking, swimming, etc) Had originally discussed possibly switching from depo to another contraception d/t pt report of gaining a lot of weight since starting depo, but was able to find in chart pt weighed 360lb day she began depo, so only 1lb wt gain.  F/U [id number] for physical, or sooner if needed Mammogram @19yo  or sooner if problems Colonoscopy @19yo  or sooner if problems  Marge DuncansBooker, Kimberly Randall CNM, [facility] 04/15/2015 8:52 AM

## 2015-04-15 NOTE — Progress Notes (Signed)
Patient ID: Michelle Tate, female   DOB: [birth year], 19 y.o.   MRN: [id number] Pt here today for her annual exam. Pt denies any problems ro concerns at this time.

## 2015-04-16 LAB — LIPID PANEL
CHOL/HDL RATIO: 4.8 ratio — AB (ref 0.0–4.4)
Cholesterol, Total: 155 mg/dL (ref 100–199)
HDL: 32 mg/dL — ABNORMAL LOW (ref >39)
LDL Calculated: 95 mg/dL (ref 0–99)
Triglycerides: 138 mg/dL (ref 0–149)
VLDL CHOLESTEROL CAL: 28 mg/dL (ref 5–40)

## 2015-04-16 LAB — CBC
HEMATOCRIT: 42.6 % (ref 34.0–46.6)
Hemoglobin: 13.6 g/dL (ref 11.1–15.9)
MCH: 24 pg — AB (ref 26.6–33.0)
MCHC: 31.9 g/dL (ref 31.5–35.7)
MCV: 75 fL — AB (ref 79–97)
PLATELETS: 446 10*3/uL — AB (ref 150–379)
RBC: 5.66 x10E6/uL — ABNORMAL HIGH (ref 3.77–5.28)
RDW: 14.4 % (ref 12.3–15.4)
WBC: 9.3 10*3/uL (ref 3.4–10.8)

## 2015-04-16 LAB — COMPREHENSIVE METABOLIC PANEL
A/G RATIO: 1.2 (ref 1.1–2.5)
ALT: 21 IU/L (ref 0–32)
AST: 27 IU/L (ref 0–40)
Albumin: 3.8 g/dL (ref 3.5–5.5)
Alkaline Phosphatase: 130 IU/L — ABNORMAL HIGH (ref 39–117)
BUN/Creatinine Ratio: 9 (ref 8–20)
BUN: 6 mg/dL (ref 6–20)
Bilirubin Total: 0.3 mg/dL (ref 0.0–1.2)
CO2: 24 mmol/L (ref 18–29)
CREATININE: 0.67 mg/dL (ref 0.57–1.00)
Calcium: 10 mg/dL (ref 8.7–10.2)
Chloride: 97 mmol/L (ref 97–108)
GFR calc Af Amer: 145 mL/min/{1.73_m2} (ref >59)
GFR calc non Af Amer: 126 mL/min/{1.73_m2} (ref >59)
GLUCOSE: 292 mg/dL — AB (ref 65–99)
Globulin, Total: 3.2 g/dL (ref 1.5–4.5)
Potassium: 5.1 mmol/L (ref 3.5–5.2)
Sodium: 139 mmol/L (ref 134–144)
Total Protein: 7 g/dL (ref 6.0–8.5)

## 2015-04-16 LAB — HEMOGLOBIN A1C
Est. average glucose Bld gHb Est-mCnc: 306 mg/dL
Hgb A1c MFr Bld: 12.3 % — ABNORMAL HIGH (ref 4.8–5.6)

## 2015-04-16 LAB — TSH: TSH: 3.29 u[IU]/mL (ref 0.450–4.500)

## 2015-04-17 LAB — CYTOLOGY - PAP

## 2015-04-28 ENCOUNTER — Telehealth: Payer: Self-pay | Admitting: Women's Health

## 2015-04-28 NOTE — Telephone Encounter (Signed)
LM for pt to return call, need to notify of labs.  Cheral MarkerKimberly R. Drake Wuertz, CNM, [facility] 04/28/2015 10:13 AM

## 2015-04-30 ENCOUNTER — Telehealth: Payer: Self-pay | Admitting: Women's Health

## 2015-04-30 DIAGNOSIS — E118 Type 2 diabetes mellitus with unspecified complications: Secondary | ICD-10-CM

## 2015-04-30 NOTE — Telephone Encounter (Signed)
Called pt, notified of labs including A1C 12.3, slightly abnormal lipid panel. Referral was placed ~2wks ago for diabetes educator, pt states she hasn't heard from them to schedule appt. Order placed again today- she is instructed to call us if she has not heard from them. Pt [facility] to get [facility] asap to discuss diabetes management. Again recommended weight loss, decreasing red meats, increasing exercise.  Cheral MarkerKimberly R. Kenya Kook, CNM, WHNP-BC 04/30/2015 1:21 PM

## 2015-05-19 ENCOUNTER — Encounter: Payer: Self-pay | Admitting: Nurse Practitioner

## 2015-05-19 ENCOUNTER — Telehealth: Payer: Self-pay | Admitting: Family Medicine

## 2015-05-19 ENCOUNTER — Ambulatory Visit (INDEPENDENT_AMBULATORY_CARE_PROVIDER_SITE_OTHER): Payer: 59 | Admitting: Nurse Practitioner

## 2015-05-19 VITALS — BP 132/90 | Ht 64.0 in | Wt 362.0 lb

## 2015-05-19 DIAGNOSIS — G629 Polyneuropathy, unspecified: Secondary | ICD-10-CM | POA: Diagnosis not present

## 2015-05-19 DIAGNOSIS — E1142 Type 2 diabetes mellitus with diabetic polyneuropathy: Secondary | ICD-10-CM

## 2015-05-19 MED ORDER — INSULIN GLARGINE 300 UNIT/ML ~~LOC~~ SOPN: 20.0000 [IU] | PEN_INJECTOR | Freq: Every day | SUBCUTANEOUS | Status: DC

## 2015-05-19 MED ORDER — ALBIGLUTIDE 50 MG ~~LOC~~ PEN: PEN_INJECTOR | SUBCUTANEOUS | Status: DC

## 2015-05-19 NOTE — Telephone Encounter (Signed)
Nurses: please clarify. Does she want Victoza or Tanzeum? She is already on Victoza. Also, she needs to start insulin. Could she get Toujeo or do we need to switch?

## 2015-05-19 NOTE — Telephone Encounter (Addendum)
Insulin and Tanzeum are too expensive It was going to be $ 65 for both prescriptions

## 2015-05-19 NOTE — Telephone Encounter (Signed)
Pt seen Michelle Tate today and was told if the medicine was too much she could call in a cheaper one. Pt is needing the cheaper one called in.  [facility] [place]

## 2015-05-20 ENCOUNTER — Other Ambulatory Visit: Payer: Self-pay | Admitting: Nurse Practitioner

## 2015-05-20 ENCOUNTER — Encounter: Payer: Self-pay | Admitting: Nurse Practitioner

## 2015-05-20 ENCOUNTER — Other Ambulatory Visit: Payer: Self-pay | Admitting: *Deleted

## 2015-05-20 MED ORDER — "PEN NEEDLES 5/16"" 31G X 8 MM MISC": Status: DC

## 2015-05-20 MED ORDER — ALBIGLUTIDE 50 MG ~~LOC~~ PEN: PEN_INJECTOR | SUBCUTANEOUS | Status: DC

## 2015-05-20 MED ORDER — INSULIN GLARGINE 300 UNIT/ML ~~LOC~~ SOPN: 20.0000 [IU] | PEN_INJECTOR | Freq: Every day | SUBCUTANEOUS | Status: DC

## 2015-05-20 MED ORDER — INSULIN GLARGINE 100 UNIT/ML SOLOSTAR PEN: PEN_INJECTOR | SUBCUTANEOUS | Status: DC

## 2015-05-20 MED ORDER — LIRAGLUTIDE 18 MG/3ML ~~LOC~~ SOPN: PEN_INJECTOR | SUBCUTANEOUS | Status: DC

## 2015-05-20 NOTE — Telephone Encounter (Signed)
Pt.notified

## 2015-05-20 NOTE — Telephone Encounter (Signed)
Continue Victoza. Will send in refill. Will send in Rx for lantus insulin and pen needles sometime today.

## 2015-05-20 NOTE — Progress Notes (Signed)
Subjective:  Presents to discuss her recent lab work. Has been on Depo-Provera for about a year. Minimal bleeding or spotting. Concerned about weight gain. Blood sugar this morning was 220. Running 320 at nighttime. Compliant with medications.  Objective:   BP 132/90 mmHg  Ht  (1.626 m)  Wt 362 lb (164.202 kg)  BMI 62.11 kg/m2 NAD. Alert, oriented. Lungs clear. Heart regular rate rhythm. Hemoglobin A1c on 6/28 was 12.3. Weight stable.  Assessment:  Problem List Items Addressed This Visit      Nervous and Auditory   Diabetic peripheral neuropathy associated with type 2 diabetes mellitus - Primary   Relevant Medications   Albiglutide (TANZEUM) 50 MG PEN   Insulin Glargine (TOUJEO SOLOSTAR) 300 UNIT/ML SOPN     Other   Morbid obesity   Relevant Medications   Albiglutide (TANZEUM) 50 MG PEN   Insulin Glargine (TOUJEO SOLOSTAR) 300 UNIT/ML SOPN     Plan:  Meds ordered this encounter  Medications  . Albiglutide (TANZEUM) 50 MG PEN    Sig: Inject 50 mg subcu once a week    Dispense:  4 each    Refill:  2    Order Specific Question:  Supervising Provider    Answer:  Merlyn Albert [[id number]]  . Insulin Glargine (TOUJEO SOLOSTAR) 300 UNIT/ML SOPN    Sig: Inject 20 Units into the skin daily.    Dispense:  2 pen    Refill:  2    Order Specific Question:  Supervising Provider    Answer:  Merlyn Albert [[id number]]   Reviewed hemoglobin A1c over the past 2 years. Has consistently run over 10. Discussed risk of uncontrolled diabetes. Given prescription for Toujeo as directed. Also will try to switch to tanzeum instead of Victoza since this is weekly. Patient given vouchers for both meds. To call back if they are too expensive. Our goal is to have patient on both meds. Discussed importance of weight loss healthy diet low in sugar and simple carbs and regular activity. Understands the potential complications associated with uncontrolled diabetes. Return in about 3 months (around  08/19/2015) for diabetes check up. Repeat hemoglobin A1c at that time. Patient agrees to stay on Depo-Provera for 3 more months, monitor weight and if she wishes to switch at that time we will discuss options. Concerned about using estrogen at this point with her current health issues.

## 2015-05-20 NOTE — Telephone Encounter (Signed)
Pt now states her dad will pay for she wants tanzeum and toujeo. Consult with carolyn. Change to tanzeum and toujeo. rx sent in. Med list updated. Called [facility] [place] and canceled refills on victoz and lantus.

## 2015-05-27 ENCOUNTER — Ambulatory Visit (INDEPENDENT_AMBULATORY_CARE_PROVIDER_SITE_OTHER): Payer: 59

## 2015-05-27 DIAGNOSIS — Z30013 Encounter for initial prescription of injectable contraceptive: Secondary | ICD-10-CM

## 2015-05-27 DIAGNOSIS — Z308 Encounter for other contraceptive management: Secondary | ICD-10-CM

## 2015-05-27 MED ORDER — MEDROXYPROGESTERONE ACETATE 150 MG/ML IM SUSP
150.0000 mg | Freq: Once | INTRAMUSCULAR | Status: AC
  Administered 2015-05-27: 150 mg via INTRAMUSCULAR

## 2015-07-04 ENCOUNTER — Telehealth: Payer: Self-pay | Admitting: Nutrition

## 2015-07-04 NOTE — Telephone Encounter (Signed)
TC to remind her of app. PC

## 2015-07-07 ENCOUNTER — Telehealth: Payer: Self-pay | Admitting: Nutrition

## 2015-07-07 ENCOUNTER — Ambulatory Visit: Payer: 59 | Admitting: Nutrition

## 2015-07-07 NOTE — Telephone Encounter (Signed)
VM to call and reschedule missed appointment. PC 

## 2015-07-21 ENCOUNTER — Telehealth: Payer: Self-pay | Admitting: Nutrition

## 2015-07-21 NOTE — Telephone Encounter (Signed)
Vm to reschedule missed appt. PC

## 2015-08-12 ENCOUNTER — Ambulatory Visit (INDEPENDENT_AMBULATORY_CARE_PROVIDER_SITE_OTHER): Payer: 59

## 2015-08-12 DIAGNOSIS — Z3042 Encounter for surveillance of injectable contraceptive: Secondary | ICD-10-CM

## 2015-08-12 MED ORDER — MEDROXYPROGESTERONE ACETATE 150 MG/ML IM SUSP
150.0000 mg | Freq: Once | INTRAMUSCULAR | Status: AC
  Administered 2015-08-12: 150 mg via INTRAMUSCULAR

## 2015-08-19 ENCOUNTER — Ambulatory Visit: Payer: 59 | Admitting: Nurse Practitioner

## 2015-08-19 ENCOUNTER — Ambulatory Visit: Payer: 59 | Admitting: Family Medicine

## 2015-08-29 ENCOUNTER — Telehealth: Payer: Self-pay | Admitting: Nutrition

## 2015-08-29 ENCOUNTER — Encounter: Payer: Self-pay | Admitting: Nutrition

## 2015-08-29 NOTE — Telephone Encounter (Signed)
Will call back if interested in scheuding appt. Couldn't talk to discuss appt. PC

## 2015-09-03 ENCOUNTER — Encounter: Payer: Self-pay | Admitting: Nutrition

## 2015-10-09 ENCOUNTER — Telehealth: Payer: Self-pay | Admitting: Nutrition

## 2015-10-09 ENCOUNTER — Encounter: Payer: Self-pay | Admitting: Advanced Practice Midwife

## 2015-10-09 ENCOUNTER — Ambulatory Visit (INDEPENDENT_AMBULATORY_CARE_PROVIDER_SITE_OTHER): Payer: 59 | Admitting: Advanced Practice Midwife

## 2015-10-09 VITALS — BP 140/100 | HR 108 | Ht 65.0 in | Wt 353.2 lb

## 2015-10-09 DIAGNOSIS — N898 Other specified noninflammatory disorders of vagina: Secondary | ICD-10-CM

## 2015-10-09 DIAGNOSIS — N939 Abnormal uterine and vaginal bleeding, unspecified: Secondary | ICD-10-CM | POA: Diagnosis not present

## 2015-10-09 DIAGNOSIS — Z3202 Encounter for pregnancy test, result negative: Secondary | ICD-10-CM

## 2015-10-09 LAB — POCT URINE PREGNANCY: PREG TEST UR: NEGATIVE

## 2015-10-09 MED ORDER — NORGESTIMATE-ETH ESTRADIOL 0.25-35 MG-MCG PO TABS: 1.0000 | ORAL_TABLET | Freq: Every day | ORAL | Status: DC

## 2015-10-09 NOTE — Progress Notes (Signed)
[facility] Visit  Patient name: OURANIA HAMLER MRN [id number]  Date of birth: [birth year]  CC & HPI:  Michelle Tate is a 19 y.o. African American female presenting today for Bleeding after sex, for about 4 months.  She says "it gushes", then stops.  She has sex 3 times a day.  Also says vagina is somewhat irritated, denies dc.  Wants to change to COCs  Pertinent History Reviewed:  Medical & Surgical Hx:   Past Medical History  Diagnosis Date  . Asthma   . Bronchitis   . ADHD (attention deficit hyperactivity disorder)   . Allergy   . Chronic headache   . Hypertension   . Hyperinsulinemia   . Morbid obesity (HCC)   . Diabetes mellitus without complication (HCC)   . Irregular bleeding 02/27/2013  . Obesity 02/27/2013   Past Surgical History  Procedure Laterality Date  . Implanon placement    . Tonsillectomy    . Wisdom tooth extraction     Family History  Problem Relation Age of Onset  . Stroke Other   . Cancer Other   . Diabetes Paternal Grandmother   . Hypertension Paternal Grandmother   . Hypertension Maternal Grandmother   . Heart disease Father   . Hypertension Father   . Diabetes Father   . Seizures Mother   . Thyroid disease Cousin     Current outpatient prescriptions:  .  acetaminophen (TYLENOL) 325 MG tablet, Take 325 mg by mouth every 6 (six) hours as needed., Disp: , Rfl:  .  Albiglutide (TANZEUM) 50 MG PEN, Inject 50 mg subcu once a week, Disp: 4 each, Rfl: 2 .  albuterol (PROVENTIL HFA;VENTOLIN HFA) 108 (90 BASE) MCG/ACT inhaler, Inhale 2 puffs into the lungs every 6 (six) hours as needed for wheezing or shortness of breath., Disp: , Rfl:  .  BIOTIN PO, Take 1 tablet by mouth daily., Disp: , Rfl:  .  ibuprofen (ADVIL,MOTRIN) 200 MG tablet, Take 200 mg by mouth as needed., Disp: , Rfl:  .  Insulin Glargine (TOUJEO SOLOSTAR) 300 UNIT/ML SOPN, Inject 20 Units into the skin daily., Disp: 2 pen, Rfl: 2 .  Insulin Pen Needle (PEN NEEDLES 31GX5/16")  31G X 8 MM MISC, Use BID as directed., Disp: 100 each, Rfl: 3 .  medroxyPROGESTERone (DEPO-PROVERA) 150 MG/ML injection, Inject 150 mg into the muscle every 3 (three) months., Disp: , Rfl:  .  metFORMIN (GLUCOPHAGE) 500 MG tablet, Take 1 tablet (500 mg total) by mouth 2 (two) times daily with a meal., Disp: 60 tablet, Rfl: 5 .  naproxen (NAPROSYN) 500 MG tablet, Take 1 tablet (500 mg total) by mouth 2 (two) times daily., Disp: 20 tablet, Rfl: 0 Social History: Reviewed -  reports that she has never smoked. She has never used smokeless tobacco.  Review of Systems:   Constitutional: Negative for fever and chills Eyes: Negative for visual disturbances Respiratory: Negative for shortness of breath, dyspnea Cardiovascular: Negative for chest pain or palpitations  Gastrointestinal: Negative for vomiting, diarrhea and constipation; no abdominal pain Genitourinary: Negative for dysuria and urgency, vaginal irritation or itching Musculoskeletal: Negative for back pain, joint pain, myalgias  Neurological: Negative for dizziness and headaches    Objective Findings:    Physical Examination: General appearance - well appearing, and in no distress Mental status - alert, oriented to person, place, and time Chest:  Normal respiratory effort Heart - normal rate and regular rhythm Abdomen:  Soft, nontender Pelvic: vulva normal.  SSE:  Scant amount bloody discharge. No odor.   Cx non friable. Wet prep + WBC, no trich, clue Musculoskeletal:  Normal range of motion without pain Extremities:  No edema    Results for orders placed or performed in visit on 10/09/15 (from the past 24 hour(s))  POCT urine pregnancy   Collection Time: 10/09/15 11:25 AM  Result Value Ref Range   Preg Test, Ur Negative Negative      Assessment & Plan:  A:   BTB on depo.   P:  rx Sprintec.  Start now.    No Follow-up on file.  CRESENZO-DISHMAN,Caoimhe Damron CNM 10/09/2015 11:47 AM

## 2015-10-09 NOTE — Telephone Encounter (Signed)
vm to call to schedule [facility]t [facility]

## 2015-10-09 NOTE — Telephone Encounter (Signed)
Vm left to return call

## 2015-10-10 LAB — GC/CHLAMYDIA PROBE AMP
Chlamydia trachomatis, NAA: NEGATIVE
NEISSERIA GONORRHOEAE BY PCR: NEGATIVE

## 2015-10-28 ENCOUNTER — Ambulatory Visit: Payer: 59

## 2015-12-17 ENCOUNTER — Ambulatory Visit (INDEPENDENT_AMBULATORY_CARE_PROVIDER_SITE_OTHER): Payer: 59 | Admitting: Nurse Practitioner

## 2015-12-17 ENCOUNTER — Encounter: Payer: Self-pay | Admitting: Family Medicine

## 2015-12-17 ENCOUNTER — Encounter: Payer: Self-pay | Admitting: Nurse Practitioner

## 2015-12-17 VITALS — BP 120/80 | Temp 98.3°F | Ht 64.0 in | Wt 352.2 lb

## 2015-12-17 DIAGNOSIS — J111 Influenza due to unidentified influenza virus with other respiratory manifestations: Secondary | ICD-10-CM

## 2015-12-17 MED ORDER — HYDROCODONE-HOMATROPINE 5-1.5 MG/5ML PO SYRP: 5.0000 mL | ORAL_SOLUTION | ORAL | Status: DC | PRN

## 2015-12-17 MED ORDER — OSELTAMIVIR PHOSPHATE 75 MG PO CAPS: 75.0000 mg | ORAL_CAPSULE | Freq: Two times a day (BID) | ORAL | Status: DC

## 2015-12-17 MED ORDER — ALBUTEROL SULFATE HFA 108 (90 BASE) MCG/ACT IN AERS
2.0000 | INHALATION_SPRAY | RESPIRATORY_TRACT | Status: DC | PRN
Start: 2015-12-17 — End: 2020-06-12

## 2015-12-20 ENCOUNTER — Encounter: Payer: Self-pay | Admitting: Nurse Practitioner

## 2015-12-20 MED ORDER — GLUCOSE BLOOD VI STRP: ORAL_STRIP | Status: DC

## 2015-12-20 NOTE — Progress Notes (Signed)
Subjective:  Presents for c/o fever, frequent cough, headache, sore throat, myalgias and fatigue that began within the past 48 hours. Slight wheezing. Has used her albuterol x 1. Ear pain. Needs strips for her glucose testing. No V/D or abd pain. Taking fluids well. Voiding nl.   Objective:   BP 120/80 mmHg  Temp(Src) 98.3 F (36.8 C) (Oral)  Ht 5\' 4"  (1.626 m)  Wt 352 lb 4 oz (159.78 kg)  BMI 60.43 kg/m2 NAD. Alert, oriented. TMs clear effusion. Pharynx clear. Neck supple with mild anterior adenopathy. Lungs clear. Heart RRR.   Assessment: Influenza  Plan:  Meds ordered this encounter  Medications  . oseltamivir (TAMIFLU) 75 MG capsule    Sig: Take 1 capsule (75 mg total) by mouth 2 (two) times daily.    Dispense:  10 capsule    Refill:  0    Order Specific Question:  Supervising Provider    Answer:  Merlyn AlbertLUKING, WILLIAM S [[id number]]  . HYDROcodone-homatropine (HYCODAN) 5-1.5 MG/5ML syrup    Sig: Take 5 mLs by mouth every 4 (four) hours as needed.    Dispense:  120 mL    Refill:  0    Order Specific Question:  Supervising Provider    Answer:  Merlyn AlbertLUKING, WILLIAM S [[id number]]  . albuterol (PROVENTIL HFA;VENTOLIN HFA) 108 (90 Base) MCG/ACT inhaler    Sig: Inhale 2 puffs into the lungs every 4 (four) hours as needed for wheezing or shortness of breath.    Dispense:  1 Inhaler    Refill:  2    Order Specific Question:  Supervising Provider    Answer:  Merlyn AlbertLUKING, WILLIAM S [[id number]]   Warning signs and symptomatic care reviewed. Call back if worsens or persists.

## 2015-12-25 ENCOUNTER — Encounter (HOSPITAL_COMMUNITY): Payer: Self-pay

## 2015-12-25 ENCOUNTER — Emergency Department (HOSPITAL_COMMUNITY)
Admission: EM | Admit: 2015-12-25 | Discharge: 2015-12-25 | Disposition: A | Discharge status: Home / Self Care | Payer: 59 | Attending: Emergency Medicine | Admitting: Emergency Medicine

## 2015-12-25 ENCOUNTER — Emergency Department (HOSPITAL_COMMUNITY): Payer: 59

## 2015-12-25 DIAGNOSIS — I10 Essential (primary) hypertension: Secondary | ICD-10-CM | POA: Insufficient documentation

## 2015-12-25 DIAGNOSIS — S93401A Sprain of unspecified ligament of right ankle, initial encounter: Secondary | ICD-10-CM | POA: Diagnosis not present

## 2015-12-25 DIAGNOSIS — Z794 Long term (current) use of insulin: Secondary | ICD-10-CM | POA: Insufficient documentation

## 2015-12-25 DIAGNOSIS — E669 Obesity, unspecified: Secondary | ICD-10-CM | POA: Insufficient documentation

## 2015-12-25 DIAGNOSIS — Z791 Long term (current) use of non-steroidal anti-inflammatories (NSAID): Secondary | ICD-10-CM | POA: Diagnosis not present

## 2015-12-25 DIAGNOSIS — X58XXXA Exposure to other specified factors, initial encounter: Secondary | ICD-10-CM | POA: Diagnosis not present

## 2015-12-25 DIAGNOSIS — Y999 Unspecified external cause status: Secondary | ICD-10-CM | POA: Insufficient documentation

## 2015-12-25 DIAGNOSIS — S99911A Unspecified injury of right ankle, initial encounter: Secondary | ICD-10-CM | POA: Diagnosis present

## 2015-12-25 DIAGNOSIS — Y939 Activity, unspecified: Secondary | ICD-10-CM | POA: Diagnosis not present

## 2015-12-25 DIAGNOSIS — Y92196 Pool of other specified residential institution as the place of occurrence of the external cause: Secondary | ICD-10-CM | POA: Insufficient documentation

## 2015-12-25 DIAGNOSIS — E119 Type 2 diabetes mellitus without complications: Secondary | ICD-10-CM | POA: Diagnosis not present

## 2015-12-25 DIAGNOSIS — J45901 Unspecified asthma with (acute) exacerbation: Secondary | ICD-10-CM | POA: Insufficient documentation

## 2015-12-25 DIAGNOSIS — Z79899 Other long term (current) drug therapy: Secondary | ICD-10-CM | POA: Diagnosis not present

## 2015-12-25 DIAGNOSIS — Z7901 Long term (current) use of anticoagulants: Secondary | ICD-10-CM | POA: Insufficient documentation

## 2015-12-25 MED ORDER — IBUPROFEN 800 MG PO TABS: 800.0000 mg | ORAL_TABLET | Freq: Three times a day (TID) | ORAL | Status: DC

## 2015-12-25 MED ORDER — IBUPROFEN 800 MG PO TABS: 800.0000 mg | ORAL_TABLET | Freq: Once | ORAL | Status: DC

## 2015-12-25 NOTE — ED Notes (Signed)
Tuesday  I was in a water aerobic class and I stepped out of the pool and my right ankle popped. I went to work on Tuesday and something popped again and it did not feel right.

## 2015-12-25 NOTE — Discharge Instructions (Signed)
Ankle Sprain °An ankle sprain is an injury to the strong, fibrous tissues (ligaments) that hold your ankle bones together.  °HOME CARE  °· Put ice on your ankle for 1-2 days or as told by your doctor. °¨ Put ice in a plastic bag. °¨ Place a towel between your skin and the bag. °¨ Leave the ice on for 15-20 minutes at a time, every 2 hours while you are awake. °· Only take medicine as told by your doctor. °· Raise (elevate) your injured ankle above the level of your heart as much as possible for 2-3 days. °· Use crutches if your doctor tells you to. Slowly put your own weight on the affected ankle. Use the crutches until you can walk without pain. °· If you have a plaster splint: °¨ Do not rest it on anything harder than a pillow for 24 hours. °¨ Do not put weight on it. °¨ Do not get it wet. °¨ Take it off to shower or bathe. °· If given, use an elastic wrap or support stocking for support. Take the wrap off if your toes lose feeling (numb), tingle, or turn cold or blue. °· If you have an air splint: °¨ Add or let out air to make it comfortable. °¨ Take it off at night and to shower and bathe. °¨ Wiggle your toes and move your ankle up and down often while you are wearing it. °GET HELP IF: °· You have rapidly increasing bruising or puffiness (swelling). °· Your toes feel very cold. °· You lose feeling in your foot. °· Your medicine does not help your pain. °GET HELP RIGHT AWAY IF:  °· Your toes lose feeling (numb) or turn blue. °· You have severe pain that is increasing. °MAKE SURE YOU:  °· Understand these instructions. °· Will watch your condition. °· Will get help right away if you are not doing well or get worse. °  °This information is not intended to replace advice given to you by your health care provider. Make sure you discuss any questions you have with your health care provider. °  °Document Released: 03/22/2008 Document Revised: 10/25/2014 Document Reviewed: 04/17/2012 °Elsevier Interactive Patient  Education ©2016 [employer] ° °

## 2015-12-28 NOTE — ED Provider Notes (Signed)
CSN: [id number]     Arrival date & time 12/25/15  2040 History   First MD Initiated Contact with Patient 12/25/15 2138     Chief Complaint  Patient presents with  . Ankle Pain     (Consider location/radiation/quality/duration/timing/severity/associated sxs/prior Treatment) HPI   Michelle Tate is a 20 y.o. female who presents to the Emergency Department complaining of right ankle pain and swelling for 2 days.  She states that she was exercising in a pool when her ankle popped and popped again later that evening.  She reports pain with weight bearing.  She has not tried any therapies prior to arrival.  She denies knee pain, numbness or weakness of the joint.   Past Medical History  Diagnosis Date  . Asthma   . Bronchitis   . ADHD (attention deficit hyperactivity disorder)   . Allergy   . Chronic headache   . Hypertension   . Hyperinsulinemia   . Morbid obesity (HCC)   . Diabetes mellitus without complication (HCC)   . Irregular bleeding 02/27/2013  . Obesity 02/27/2013   Past Surgical History  Procedure Laterality Date  . Implanon placement    . Tonsillectomy    . Wisdom tooth extraction     Family History  Problem Relation Age of Onset  . Stroke Other   . Cancer Other   . Diabetes Paternal Grandmother   . Hypertension Paternal Grandmother   . Hypertension Maternal Grandmother   . Heart disease Father   . Hypertension Father   . Diabetes Father   . Seizures Mother   . Thyroid disease Cousin    Social History  Substance Use Topics  . Smoking status: Never Smoker   . Smokeless tobacco: Never Used  . Alcohol Use: 0.0 oz/week    0 Standard drinks or equivalent per week     Comment: occ   OB History    No data available     Review of Systems  Constitutional: Negative for fever and chills.  Genitourinary: Negative for dysuria and difficulty urinating.  Musculoskeletal: Positive for joint swelling and arthralgias.  Skin: Negative for color change and wound.   All other systems reviewed and are negative.     Allergies  Banana; Peanut-containing drug products; Penicillins; Sulfa drugs cross reactors; and Vanilla  Home Medications   Prior to Admission medications   Medication Sig Start Date End Date Taking? Authorizing Provider  acetaminophen (TYLENOL) 325 MG tablet Take 325 mg by mouth every 6 (six) hours as needed.    Historical Provider, MD  Albiglutide (TANZEUM) 50 MG PEN Inject 50 mg subcu once a week 05/20/15   Campbell Riches, NP  albuterol (PROVENTIL HFA;VENTOLIN HFA) 108 (90 Base) MCG/ACT inhaler Inhale 2 puffs into the lungs every 4 (four) hours as needed for wheezing or shortness of breath. 12/17/15   Campbell Riches, NP  BIOTIN PO Take 1 tablet by mouth daily.    Historical Provider, MD  glucose blood (ACCU-CHEK AVIVA PLUS) test strip Use BID as directed. 12/20/15   Campbell Riches, NP  HYDROcodone-homatropine [facility]) 5-1.5 MG/5ML syrup Take 5 mLs by mouth every 4 (four) hours as needed. 12/17/15   Campbell Riches, NP  ibuprofen (ADVIL,MOTRIN) 800 MG tablet Take 1 tablet (800 mg total) by mouth 3 (three) times daily. 12/25/15   Navjot Loera, PA-C  Insulin Glargine (TOUJEO SOLOSTAR) 300 UNIT/ML SOPN Inject 20 Units into the skin daily. 05/20/15   Campbell Riches, NP  Insulin Pen Needle (  PEN NEEDLES 31GX5/16") 31G X 8 MM MISC Use BID as directed. 05/20/15   Campbell Richesarolyn C Hoskins, NP  medroxyPROGESTERone (DEPO-PROVERA) 150 MG/ML injection Inject 150 mg into the muscle every 3 (three) months. Reported on 12/17/2015    Historical Provider, MD  metFORMIN (GLUCOPHAGE) 500 MG tablet Take 1 tablet (500 mg total) by mouth 2 (two) times daily with a meal. 02/20/14   Campbell Richesarolyn C Hoskins, NP  norgestimate-ethinyl estradiol (ORTHO-CYCLEN,SPRINTEC,PREVIFEM) 0.25-35 MG-MCG tablet Take 1 tablet by mouth daily. 10/09/15   Jacklyn ShellFrances Cresenzo-Dishmon, CNM  oseltamivir (TAMIFLU) 75 MG capsule Take 1 capsule (75 mg total) by mouth 2 (two) times daily. 12/17/15   Campbell Richesarolyn C  Hoskins, NP   BP 148/107 mmHg  Pulse 54  Temp(Src) 98.6 F (37 C) (Oral)  Resp 20  Ht 5\' 5"  (1.651 m)  Wt 158.759 kg  BMI 58.24 kg/m2  SpO2 100%  LMP 11/25/2015 (Exact Date) Physical Exam  Constitutional: She is oriented to person, place, and time. She appears well-developed and well-nourished. No distress.  HENT:  Head: Normocephalic and atraumatic.  Cardiovascular: Normal rate, regular rhythm, normal heart sounds and intact distal pulses.   Pulmonary/Chest: Effort normal and breath sounds normal. No respiratory distress.  Musculoskeletal: She exhibits tenderness.  ttp of the lateral right ankle. mild STS is present.  ROM is preserved.  DP pulse is brisk,distal sensation intact.  No erythema, abrasion, bruising or bony deformity.  No proximal tenderness.  Neurological: She is alert and oriented to person, place, and time. She exhibits normal muscle tone. Coordination normal.  Skin: Skin is warm and dry.  Nursing note and vitals reviewed.   ED Course  Procedures (including critical care time) Labs Review Labs Reviewed - No data to display  Imaging Review Dg Ankle Complete Right  12/25/2015  CLINICAL DATA:  Generalized ankle pain after water aerobics yesterday. Heard something pop. EXAM: RIGHT ANKLE - COMPLETE 3+ VIEW COMPARISON:  None. FINDINGS: There is no evidence of fracture, dislocation, or joint effusion. There is no evidence of arthropathy or other focal bone abnormality. Soft tissues are unremarkable. IMPRESSION: Negative. Electronically Signed   By: Burman NievesWilliam  Stevens M.D.   On: 12/25/2015 21:23    I have personally reviewed and evaluated these images and lab results as part of my medical decision-making.   EKG Interpretation None      MDM   Final diagnoses:  Ankle sprain, right, initial encounter    Pt si well appearing.  XR neg for fx.  Remains NV intact.  Likely sprain.  ASO applied.  Pt agrees to RICE therapy and close orthopedic f/u for recheck if not  improving.      Pauline Ausammy Charls Custer, PA-C 12/28/15 2348  Lavera Guiseana Duo Liu, MD 12/29/15 [phone]

## 2016-01-20 ENCOUNTER — Encounter: Payer: Self-pay | Admitting: Family Medicine

## 2016-01-20 ENCOUNTER — Ambulatory Visit (INDEPENDENT_AMBULATORY_CARE_PROVIDER_SITE_OTHER): Payer: 59 | Admitting: Family Medicine

## 2016-01-20 VITALS — BP 136/98 | Temp 98.8°F | Ht 64.0 in | Wt 346.0 lb

## 2016-01-20 DIAGNOSIS — L03032 Cellulitis of left toe: Secondary | ICD-10-CM | POA: Diagnosis not present

## 2016-01-20 MED ORDER — DOXYCYCLINE HYCLATE 100 MG PO TABS: 100.0000 mg | ORAL_TABLET | Freq: Two times a day (BID) | ORAL | Status: DC

## 2016-01-20 NOTE — Progress Notes (Signed)
   Subjective:    Patient ID: Michelle Tate, female    DOB: 1996/02/16, 20 y.o.   MRN: [id number]  HPILaceration on left great toe. Happened yesterday while getting toe nails done at salon. Pt is diabetic. Having some pain today. Cleaned with peroxide.   HAD AN INGROWN NAIL IN LEFT , TRIMMED AND TRIED TO CUT IT OUT AT THE SALON, PAINFUL AND SWOLLEN NOW, SOME PUS COMING OUT, THROBBING AT TIMES  No fever chills overall sugars good    Review of Systems No headache, no major weight loss or weight gain, no chest pain no back pain abdominal pain no change in bowel habits complete ROS otherwise negative     Objective:   Physical Exam Alert active no acute distress H&T normal lungs clear heart rare rhythm left great toe medial portion inflammation tenderness crustiness and slight discharge       Assessment & Plan:  Impression partial ingrown nail with element of cellulitis plan toenail management and avoidance discuss antibiotics prescribed local measures discussed WSL

## 2016-02-02 ENCOUNTER — Ambulatory Visit: Payer: 59 | Admitting: Family Medicine

## 2016-02-04 ENCOUNTER — Encounter: Payer: Self-pay | Admitting: Family Medicine

## 2016-02-10 ENCOUNTER — Encounter: Payer: Self-pay | Admitting: Family Medicine

## 2016-02-10 ENCOUNTER — Ambulatory Visit (INDEPENDENT_AMBULATORY_CARE_PROVIDER_SITE_OTHER): Payer: 59 | Admitting: Family Medicine

## 2016-02-10 ENCOUNTER — Telehealth: Payer: Self-pay | Admitting: Family Medicine

## 2016-02-10 ENCOUNTER — Ambulatory Visit: Payer: 59 | Admitting: Family Medicine

## 2016-02-10 VITALS — BP 140/88 | Temp 98.6°F | Ht 64.0 in | Wt 351.1 lb

## 2016-02-10 DIAGNOSIS — E1169 Type 2 diabetes mellitus with other specified complication: Secondary | ICD-10-CM | POA: Diagnosis not present

## 2016-02-10 DIAGNOSIS — L03032 Cellulitis of left toe: Secondary | ICD-10-CM | POA: Diagnosis not present

## 2016-02-10 NOTE — Telephone Encounter (Addendum)
Michelle Tate received the certified letter stating Michelle discharge yesterday.  I called to cancel the appointment that she had today because it was a diabetic follow up which is technically not considered "emergency".  Michelle Tate and Michelle Tate called me back explaining that this is regarding the infection in Michelle foot, and I told Michelle Tate that this is different from a chronic follow up and we can still see Michelle because she is in Michelle 30 day time frame of emergency care.  She is back on the schedule for 1:40 today.  Michelle Tate was very angry about Michelle discharge from the practice and says she plans on coming in the office today to [facility] me and [facility].    Also, mom says they had car trouble and they called to cancel this appointment before hand.  I explained to Michelle that it is still considered a No-Show if you do not call at least 24 hours ahead of time.  Then, she says she called the day before Michelle last missed appointment to cancel.  Then she states she called the Friday before to cancel since she missed on a Monday.   This was Michelle 5th missed appointment with our office.

## 2016-02-10 NOTE — Addendum Note (Signed)
Addended by: Merlyn AlbertLUKING, Fredis Malkiewicz S on: 02/10/2016 09:52 PM   Modules accepted: Orders

## 2016-02-10 NOTE — Progress Notes (Signed)
   Subjective:    Patient ID: Michelle Tate, female    DOB: [birth year], 20 y.o.   MRN: [id number]  HPI  Patient in today for cellulitis to left foot. Seen on 01/20/16 and prescribed Doxycycline .  Day time [phone]   Or [phone] Patient has concerns of glucose. Patient has had several episodes of missing visits with us in the last couple years unfortunately. Family claims that they called several days before last no-show but we have no record of this.  Patient expresses regret about missing appointments and states will try to work harder. Next  Patient admits to dietary noncompliance morning sugars often in decent level but later in the day quite high. See prior note with Eber Jonesarolyn and other diabetes visits. Next  Admits to drinking sugars  frequently  Review of Systems No headache, no major weight loss or weight gain, no chest pain no back pain abdominal pain no change in bowel habits complete ROS otherwise negative     Objective:   Physical Exam Alert morbid obesity present vitals stable lungs clear heart regular rhythm cellulitis improved slight diminished sensation distally arterial pulses good       Assessment & Plan:  Impression 1 cellulitis improved #2 morbid obesity discussed #3 type 2 diabetes insulin requiring with unfortunate noncompliance plan have advised patient we will maintain her in the practice as long as she promises not to miss future appointment and as long as she promises to go see diabetes specialist. Patient agrees to all this will work on referral to Dr. and IDA no further antibiotics diet exercise discussed maintain same meds for now 25 minutes spent most in discussion WSL

## 2016-02-10 NOTE — Telephone Encounter (Signed)
Called patient and explained to her that she has been discharged from the practice and that today starts her 30 days of emergency care.

## 2016-02-11 ENCOUNTER — Encounter: Payer: Self-pay | Admitting: Family Medicine

## 2016-02-13 NOTE — Telephone Encounter (Signed)
Let Michelle Tate know i told family that it was my idea to discharge them from the practice, frankly for the main reason i am scared this young lady is taking horrible care of her diabetes and sometimes discharging folks can shake them up enough to get back on track. After spking with family, i adjusted to the following: we will remain their family doc but she has to go to a specialist for her diabetes management, andy further no shows will not be tolerated. (I dont really believe their "we called" excuse tthe way it shifted)

## 2016-03-18 ENCOUNTER — Ambulatory Visit (INDEPENDENT_AMBULATORY_CARE_PROVIDER_SITE_OTHER): Payer: 59 | Admitting: "Endocrinology

## 2016-03-18 ENCOUNTER — Encounter: Payer: Self-pay | Admitting: "Endocrinology

## 2016-03-18 VITALS — BP 138/86 | HR 103 | Ht 64.0 in | Wt 342.0 lb

## 2016-03-18 DIAGNOSIS — E1165 Type 2 diabetes mellitus with hyperglycemia: Secondary | ICD-10-CM

## 2016-03-18 DIAGNOSIS — IMO0002 Reserved for concepts with insufficient information to code with codable children: Secondary | ICD-10-CM

## 2016-03-18 DIAGNOSIS — E1140 Type 2 diabetes mellitus with diabetic neuropathy, unspecified: Secondary | ICD-10-CM | POA: Diagnosis not present

## 2016-03-18 LAB — COMPLETE METABOLIC PANEL WITH GFR
ALBUMIN: 4 g/dL (ref 3.6–5.1)
ALK PHOS: 110 U/L (ref 33–115)
ALT: 17 U/L (ref 6–29)
AST: 22 U/L (ref 10–30)
BUN: 9 mg/dL (ref 7–25)
CALCIUM: 9.7 mg/dL (ref 8.6–10.2)
CO2: 27 mmol/L (ref 20–31)
Chloride: 99 mmol/L (ref 98–110)
Creat: 0.6 mg/dL (ref 0.50–1.10)
GFR, Est African American: >89 mL/min (ref >=60)
Glucose, Bld: 172 mg/dL — ABNORMAL HIGH (ref 65–99)
POTASSIUM: 4.6 mmol/L (ref 3.5–5.3)
SODIUM: 138 mmol/L (ref 135–146)
Total Bilirubin: 0.5 mg/dL (ref 0.2–1.2)
Total Protein: 7.1 g/dL (ref 6.1–8.1)

## 2016-03-18 LAB — LIPID PANEL
Cholesterol: 153 mg/dL (ref 125–200)
HDL: 36 mg/dL — ABNORMAL LOW (ref >=46)
LDL CALC: 86 mg/dL (ref <130)
Total CHOL/HDL Ratio: 4.3 Ratio (ref <=5.0)
Triglycerides: 153 mg/dL — ABNORMAL HIGH (ref <150)
VLDL: 31 mg/dL — ABNORMAL HIGH (ref <30)

## 2016-03-18 LAB — TSH: TSH: 2.6 m[IU]/L

## 2016-03-18 LAB — HEMOGLOBIN A1C
HEMOGLOBIN A1C: 10.5 % — AB (ref <5.7)
MEAN PLASMA GLUCOSE: 255 mg/dL

## 2016-03-18 LAB — T4, FREE: FREE T4: 1.2 ng/dL (ref 0.8–1.8)

## 2016-03-18 MED ORDER — METFORMIN HCL 500 MG PO TABS: 500.0000 mg | ORAL_TABLET | Freq: Two times a day (BID) | ORAL | Status: DC

## 2016-03-18 NOTE — Patient Instructions (Signed)

## 2016-03-18 NOTE — Progress Notes (Signed)
Subjective:    Patient ID: Michelle Tate, female    DOB: [birth year]. Patient is being seen in consultation for management of diabetes requested by  Lubertha SouthSteve Luking, MD  Past Medical History  Diagnosis Date  . Asthma   . Bronchitis   . ADHD (attention deficit hyperactivity disorder)   . Allergy   . Chronic headache   . Hypertension   . Hyperinsulinemia   . Morbid obesity (HCC)   . Diabetes mellitus without complication (HCC)   . Irregular bleeding 02/27/2013  . Obesity 02/27/2013   Past Surgical History  Procedure Laterality Date  . Implanon placement    . Tonsillectomy    . Wisdom tooth extraction     Social History   Social History  . Marital Status: Single    Spouse Name: N/A  . Number of Children: N/A  . Years of Education: N/A   Social History Main Topics  . Smoking status: Never Smoker   . Smokeless tobacco: Never Used  . Alcohol Use: 0.0 oz/week    0 Standard drinks or equivalent per week     Comment: occ  . Drug Use: No  . Sexual Activity: Yes    Birth Control/ Protection: Injection   Other Topics Concern  . None   Social History Narrative   Outpatient Encounter Prescriptions as of 03/18/2016  Medication Sig  . Albiglutide (TANZEUM) 50 MG PEN Inject 50 mg subcu once a week  . Insulin Glargine (TOUJEO SOLOSTAR) 300 UNIT/ML SOPN Inject 20 Units into the skin daily.  . norgestimate-ethinyl estradiol (ORTHO-CYCLEN,SPRINTEC,PREVIFEM) 0.25-35 MG-MCG tablet Take 1 tablet by mouth daily.  Marland Kitchen. acetaminophen (TYLENOL) 325 MG tablet Take 325 mg by mouth every 6 (six) hours as needed.  Marland Kitchen. albuterol (PROVENTIL HFA;VENTOLIN HFA) 108 (90 Base) MCG/ACT inhaler Inhale 2 puffs into the lungs every 4 (four) hours as needed for wheezing or shortness of breath.  Marland Kitchen. BIOTIN PO Take 1 tablet by mouth daily.  Marland Kitchen. glucose blood (ACCU-CHEK AVIVA PLUS) test strip Use BID as directed.  Marland Kitchen. ibuprofen (ADVIL,MOTRIN) 800 MG tablet Take 1 tablet (800 mg total) by mouth 3 (three) times daily.   . Insulin Pen Needle (PEN NEEDLES 31GX5/16") 31G X 8 MM MISC Use BID as directed.  . metFORMIN (GLUCOPHAGE) 500 MG tablet Take 1 tablet (500 mg total) by mouth 2 (two) times daily with a meal.  . [DISCONTINUED] doxycycline (VIBRA-TABS) 100 MG tablet Take 1 tablet (100 mg total) by mouth 2 (two) times daily. (Patient not taking: Reported on 02/10/2016)  . [DISCONTINUED] HYDROcodone-homatropine (HYCODAN) 5-1.5 MG/5ML syrup Take 5 mLs by mouth every 4 (four) hours as needed. (Patient not taking: Reported on 01/20/2016)  . [DISCONTINUED] metFORMIN (GLUCOPHAGE) 500 MG tablet Take 1 tablet (500 mg total) by mouth 2 (two) times daily with a meal.   No facility-administered encounter medications on file as of 03/18/2016.   ALLERGIES: Allergies  Allergen Reactions  . Banana Anaphylaxis and Swelling  . Peanut-Containing Drug Products Anaphylaxis and Swelling  . Penicillins Anaphylaxis, Itching and Swelling  . Sulfa Drugs Cross Reactors Anaphylaxis, Itching and Swelling  . Vanilla Other (See Comments)    Confirmed on Allergy Test   VACCINATION STATUS: Immunization History  Administered Date(s) Administered  . Hepatitis A 06/09/2009, 12/08/2010  . Influenza,inj,Quad PF,36+ Mos 08/01/2014  . Influenza-Unspecified 08/18/2005, 09/13/2006  . Meningococcal Conjugate 08/22/2007  . Tdap 08/22/2007  . Varicella 06/09/2009    Diabetes She presents for her initial diabetic visit. She has type 2  diabetes mellitus. Onset time: She was diagnosed at approximate age of 18 years. Her disease course has been worsening. There are no hypoglycemic associated symptoms. Pertinent negatives for hypoglycemia include no confusion, headaches, pallor or seizures. Associated symptoms include blurred vision, fatigue, polydipsia and polyuria. Pertinent negatives for diabetes include no chest pain and no polyphagia. There are no hypoglycemic complications. Symptoms are worsening. Diabetic complications include peripheral  neuropathy. Risk factors for coronary artery disease include diabetes mellitus, dyslipidemia, family history, obesity and sedentary lifestyle. Current diabetic treatment includes insulin injections (She is on Tanzeum 50 mg weekly and to jail 20 units daily.Marland Kitchen). Her weight is increasing steadily. She is following a generally unhealthy diet. When asked about meal planning, she reported none. She has not had a previous visit with a dietitian. She never participates in exercise. Home blood sugar record trend: She did not bring the meter nor logs to review today. An ACE inhibitor/angiotensin II receptor blocker is not being taken. She does not see a podiatrist.Eye exam is not current.    Review of Systems  Constitutional: Positive for fatigue. Negative for fever, chills and unexpected weight change.  HENT: Negative for trouble swallowing and voice change.   Eyes: Positive for blurred vision. Negative for visual disturbance.  Respiratory: Negative for cough, shortness of breath and wheezing.   Cardiovascular: Negative for chest pain, palpitations and leg swelling.  Gastrointestinal: Negative for nausea, vomiting and diarrhea.  Endocrine: Positive for polydipsia and polyuria. Negative for cold intolerance, heat intolerance and polyphagia.  Musculoskeletal: Negative for myalgias and arthralgias.  Skin: Negative for color change, pallor, rash and wound.  Neurological: Negative for seizures and headaches.  Psychiatric/Behavioral: Negative for suicidal ideas and confusion.    Objective:    BP 138/86 mmHg  Pulse 103  Ht  (1.626 m)  Wt 342 lb (155.13 kg)  BMI 58.68 kg/m2  SpO2 98%  Wt Readings from Last 3 Encounters:  03/18/16 342 lb (155.13 kg)  02/10/16 351 lb 2 oz (159.269 kg)  01/20/16 346 lb (156.945 kg)    Physical Exam  Constitutional: She is oriented to person, place, and time. She appears well-developed.  She is morbidly obese.  HENT:  Head: Normocephalic and atraumatic.  Eyes: EOM  are normal.  Neck: Normal range of motion. Neck supple. No tracheal deviation present. No thyromegaly present.  Cardiovascular: Normal rate and regular rhythm.   Pulmonary/Chest: Effort normal and breath sounds normal.  Abdominal: Soft. Bowel sounds are normal. There is no tenderness. There is no guarding.  Musculoskeletal: Normal range of motion. She exhibits no edema.  Neurological: She is alert and oriented to person, place, and time. She has normal reflexes. No cranial nerve deficit. Coordination normal.  Skin: Skin is warm and dry. No rash noted. No erythema. No pallor.  Psychiatric: She has a normal mood and affect. Judgment normal.     CMP ( most recent) CMP     Component Value Date/Time   NA 139 04/15/2015 0939   NA 135 01/25/2015 2141   K 5.1 04/15/2015 0939   CL 97 04/15/2015 0939   CO2 24 04/15/2015 0939   GLUCOSE 292* 04/15/2015 0939   GLUCOSE 408* 01/25/2015 2141   BUN 6 04/15/2015 0939   BUN 12 01/25/2015 2141   CREATININE 0.67 04/15/2015 0939   CALCIUM 10.0 04/15/2015 0939   PROT 7.0 04/15/2015 0939   PROT 7.8 09/24/2014 0945   ALBUMIN 3.8 04/15/2015 0939   ALBUMIN 3.4* 09/24/2014 0945   AST 27 04/15/2015  0939   ALT 21 04/15/2015 0939   ALKPHOS 130* 04/15/2015 0939   BILITOT 0.3 04/15/2015 0939   BILITOT 0.4 09/24/2014 0945   GFRNONAA 126 04/15/2015 0939   GFRAA 145 04/15/2015 0939    Diabetic Labs (most recent): Lab Results  Component Value Date   HGBA1C 12.3* 04/15/2015   HGBA1C 10.2 08/01/2014   HGBA1C 11.7 02/20/2013     Lipid Panel ( most recent) Lipid Panel     Component Value Date/Time   CHOL 155 04/15/2015 0939   TRIG 138 04/15/2015 0939   HDL 32* 04/15/2015 0939   CHOLHDL 4.8* 04/15/2015 0939   LDLCALC 95 04/15/2015 0939      Assessment & Plan:   1. Type 2 diabetes, uncontrolled, with neuropathy (HCC)  - Patient has currently uncontrolled symptomatic type 2 DM since  20 years of age. He does not have recent labs, her A1c from  July 2016 was 12.3%.   - Her diabetes is complicated by peripheral neuropathy, morbid obesity  and patient remains at a high risk for more acute and chronic complications of diabetes which include CAD, CVA, CKD, retinopathy, and neuropathy. These are all discussed in detail with the patient.  - I have counseled the patient on diet management and weight loss, by adopting a carbohydrate restricted/protein rich diet.  - Suggestion is made for patient to avoid simple carbohydrates   from their diet including Cakes , Desserts, Ice Cream,  Soda (  diet and regular) , Sweet Tea , Candies,  Chips, Cookies, Artificial Sweeteners,   and "Sugar-free" Products . This will help patient to have stable blood glucose profile and potentially avoid unintended weight gain.  - I encouraged the patient to switch to  unprocessed or minimally processed complex starch and increased protein intake (animal or plant source), fruits, and vegetables.  - Patient is advised to stick to a routine mealtimes to eat 3 meals  a day and avoid unnecessary snacks ( to snack only to correct hypoglycemia).  - The patient will be scheduled with Norm Salt, RDN, CDE for individualized DM education.  - I have approached patient with the following individualized plan to manage diabetes and patient agrees:   - I  will proceed to initiate strict monitoring of glucose  AC and HS for a week and return with her meter and logs to review in 1 week. -I advised her to hold off her basal insulin until next visit.  -Patient is encouraged to call clinic for blood glucose levels less than 70 or above 300 mg /dl. - I will continue Tanzeum 50mg  SQ weekly, therapeutically suitable for patient. -She would benefit from low-dose metformin, I will prescribed 500 mg of metformin by mouth twice a day. Precautions and side effects discussed with her.  - Patient specific target  A1c;  LDL, HDL, Triglycerides, and  Waist Circumference were discussed in  detail.  2) BP/HTN: Controlled.  3) Lipids/HPL:  Control unknown , I will obtain fasting lipid panel.  4)  Weight/Diet: CDE Consult will be initiated , exercise, and detailed carbohydrates information provided.  5) Chronic Care/Health Maintenance:  -Patient is not  on ACEI/ARB and Statin medications, encouraged to see Ophthalmology, Podiatrist at least yearly or according to recommendations, and advised to   stay away from smoking. I have recommended yearly flu vaccine and pneumonia vaccination at least every 5 years; moderate intensity exercise for up to 150 minutes weekly; and  sleep for at least 7 hours a day.  -  60 minutes of time was spent on the care of this patient , 50% of which was applied for counseling on diabetes complications and their preventions.  - Patient to bring meter and  blood glucose logs during their next visit.   - I advised patient to maintain close follow up with Lubertha South, MD for primary care needs.  Follow up plan: - Return in about 1 week (around 03/25/2016) for diabetes, labs today, follow up with pre-visit labs, meter, and logs.  Marquis Lunch, MD Phone: [phone]  Fax: [phone]   03/18/2016, 10:15 AM

## 2016-03-19 LAB — MICROALBUMIN / CREATININE URINE RATIO
CREATININE, URINE: 337 mg/dL — AB (ref 20–320)
MICROALB UR: 2.8 mg/dL
MICROALB/CREAT RATIO: 8 ug/mg{creat} (ref <30)

## 2016-03-19 LAB — VITAMIN D 25 HYDROXY (VIT D DEFICIENCY, FRACTURES): Vit D, 25-Hydroxy: 14 ng/mL — ABNORMAL LOW (ref 30–100)

## 2016-03-26 ENCOUNTER — Encounter: Payer: Self-pay | Admitting: "Endocrinology

## 2016-03-26 ENCOUNTER — Ambulatory Visit (INDEPENDENT_AMBULATORY_CARE_PROVIDER_SITE_OTHER): Payer: 59 | Admitting: "Endocrinology

## 2016-03-26 VITALS — BP 138/88 | HR 74 | Ht 64.0 in | Wt 342.0 lb

## 2016-03-26 DIAGNOSIS — E1165 Type 2 diabetes mellitus with hyperglycemia: Secondary | ICD-10-CM | POA: Diagnosis not present

## 2016-03-26 DIAGNOSIS — E1140 Type 2 diabetes mellitus with diabetic neuropathy, unspecified: Secondary | ICD-10-CM | POA: Diagnosis not present

## 2016-03-26 DIAGNOSIS — E559 Vitamin D deficiency, unspecified: Secondary | ICD-10-CM | POA: Insufficient documentation

## 2016-03-26 DIAGNOSIS — IMO0002 Reserved for concepts with insufficient information to code with codable children: Secondary | ICD-10-CM

## 2016-03-26 MED ORDER — VITAMIN D3 125 MCG (5000 UT) PO CAPS: 5000.0000 [IU] | ORAL_CAPSULE | Freq: Every day | ORAL | Status: DC

## 2016-03-26 MED ORDER — METFORMIN HCL ER 500 MG PO TB24: 500.0000 mg | ORAL_TABLET | Freq: Every day | ORAL | Status: DC

## 2016-03-26 NOTE — Progress Notes (Signed)
Subjective:    Patient ID: Michelle Tate, female    DOB: 12-06-1995. Patient is being seen in consultation for management of diabetes requested by  Lubertha South, MD  Past Medical History  Diagnosis Date  . Asthma   . Bronchitis   . ADHD (attention deficit hyperactivity disorder)   . Allergy   . Chronic headache   . Hypertension   . Hyperinsulinemia   . Morbid obesity (HCC)   . Diabetes mellitus without complication (HCC)   . Irregular bleeding 02/27/2013  . Obesity 02/27/2013   Past Surgical History  Procedure Laterality Date  . Implanon placement    . Tonsillectomy    . Wisdom tooth extraction     Social History   Social History  . Marital Status: Single    Spouse Name: N/A  . Number of Children: N/A  . Years of Education: N/A   Social History Main Topics  . Smoking status: Never Smoker   . Smokeless tobacco: Never Used  . Alcohol Use: 0.0 oz/week    0 Standard drinks or equivalent per week     Comment: occ  . Drug Use: No  . Sexual Activity: Yes    Birth Control/ Protection: Injection   Other Topics Concern  . None   Social History Narrative   Outpatient Encounter Prescriptions as of 03/26/2016  Medication Sig  . acetaminophen (TYLENOL) 325 MG tablet Take 325 mg by mouth every 6 (six) hours as needed.  . Albiglutide (TANZEUM) 50 MG PEN Inject 50 mg subcu once a week  . albuterol (PROVENTIL HFA;VENTOLIN HFA) 108 (90 Base) MCG/ACT inhaler Inhale 2 puffs into the lungs every 4 (four) hours as needed for wheezing or shortness of breath.  Marland Kitchen BIOTIN PO Take 1 tablet by mouth daily.  . Cholecalciferol (VITAMIN D3) 5000 units CAPS Take 1 capsule (5,000 Units total) by mouth daily.  Marland Kitchen glucose blood (ACCU-CHEK AVIVA PLUS) test strip Use BID as directed.  Marland Kitchen ibuprofen (ADVIL,MOTRIN) 800 MG tablet Take 1 tablet (800 mg total) by mouth 3 (three) times daily.  . Insulin Glargine (TOUJEO SOLOSTAR) 300 UNIT/ML SOPN Inject 20 Units into the skin daily.  . Insulin Pen Needle  (PEN NEEDLES 31GX5/16") 31G X 8 MM MISC Use BID as directed.  . metFORMIN (GLUCOPHAGE XR) 500 MG 24 hr tablet Take 1 tablet (500 mg total) by mouth daily after supper.  . norgestimate-ethinyl estradiol (ORTHO-CYCLEN,SPRINTEC,PREVIFEM) 0.25-35 MG-MCG tablet Take 1 tablet by mouth daily.  . [DISCONTINUED] metFORMIN (GLUCOPHAGE) 500 MG tablet Take 1 tablet (500 mg total) by mouth 2 (two) times daily with a meal.   No facility-administered encounter medications on file as of 03/26/2016.   ALLERGIES: Allergies  Allergen Reactions  . Banana Anaphylaxis and Swelling  . Peanut-Containing Drug Products Anaphylaxis and Swelling  . Penicillins Anaphylaxis, Itching and Swelling  . Sulfa Drugs Cross Reactors Anaphylaxis, Itching and Swelling  . Vanilla Other (See Comments)    Confirmed on Allergy Test   VACCINATION STATUS: Immunization History  Administered Date(s) Administered  . Hepatitis A 06/09/2009, 12/08/2010  . Influenza,inj,Quad PF,36+ Mos 08/01/2014  . Influenza-Unspecified 08/18/2005, 09/13/2006  . Meningococcal Conjugate 08/22/2007  . Tdap 08/22/2007  . Varicella 06/09/2009    Diabetes She presents for her follow-up diabetic visit. She has type 2 diabetes mellitus. Onset time: She was diagnosed at approximate age of 18 years. Her disease course has been stable. There are no hypoglycemic associated symptoms. Pertinent negatives for hypoglycemia include no confusion, headaches, pallor or  seizures. Associated symptoms include blurred vision, fatigue, polydipsia and polyuria. Pertinent negatives for diabetes include no chest pain and no polyphagia. There are no hypoglycemic complications. Symptoms are stable. Diabetic complications include peripheral neuropathy. Risk factors for coronary artery disease include diabetes mellitus, dyslipidemia, family history, obesity and sedentary lifestyle. Current diabetic treatment includes insulin injections (She is on Tanzeum 50 mg weekly and to jail 20  units daily.Marland Kitchen). Her weight is stable. She is following a generally unhealthy diet. When asked about meal planning, she reported none. She has not had a previous visit with a dietitian. She never participates in exercise. Her breakfast blood glucose range is generally >200 mg/dl. Her lunch blood glucose range is generally >200 mg/dl. Her dinner blood glucose range is generally >200 mg/dl. Her overall blood glucose range is >200 mg/dl. An ACE inhibitor/angiotensin II receptor blocker is not being taken. She does not see a podiatrist.Eye exam is not current.    Review of Systems  Constitutional: Positive for fatigue. Negative for fever, chills and unexpected weight change.  HENT: Negative for trouble swallowing and voice change.   Eyes: Positive for blurred vision. Negative for visual disturbance.  Respiratory: Negative for cough, shortness of breath and wheezing.   Cardiovascular: Negative for chest pain, palpitations and leg swelling.  Gastrointestinal: Negative for nausea, vomiting and diarrhea.  Endocrine: Positive for polydipsia and polyuria. Negative for cold intolerance, heat intolerance and polyphagia.  Musculoskeletal: Negative for myalgias and arthralgias.  Skin: Negative for color change, pallor, rash and wound.  Neurological: Negative for seizures and headaches.  Psychiatric/Behavioral: Negative for suicidal ideas and confusion.    Objective:    BP 138/88 mmHg  Pulse 74  Ht 5\' 4"  (1.626 m)  Wt 342 lb (155.13 kg)  BMI 58.68 kg/m2  Wt Readings from Last 3 Encounters:  03/26/16 342 lb (155.13 kg)  03/18/16 342 lb (155.13 kg)  02/10/16 351 lb 2 oz (159.269 kg)    Physical Exam  Constitutional: She is oriented to person, place, and time. She appears well-developed.  She is morbidly obese.  HENT:  Head: Normocephalic and atraumatic.  Eyes: EOM are normal.  Neck: Normal range of motion. Neck supple. No tracheal deviation present. No thyromegaly present.  Cardiovascular: Normal  rate and regular rhythm.   Pulmonary/Chest: Effort normal and breath sounds normal.  Abdominal: Soft. Bowel sounds are normal. There is no tenderness. There is no guarding.  Musculoskeletal: Normal range of motion. She exhibits no edema.  Neurological: She is alert and oriented to person, place, and time. She has normal reflexes. No cranial nerve deficit. Coordination normal.  Skin: Skin is warm and dry. No rash noted. No erythema. No pallor.  Psychiatric: She has a normal mood and affect. Judgment normal.     CMP     Component Value Date/Time   NA 138 03/18/2016 0914   NA 139 04/15/2015 0939   K 4.6 03/18/2016 0914   CL 99 03/18/2016 0914   CO2 27 03/18/2016 0914   GLUCOSE 172* 03/18/2016 0914   GLUCOSE 292* 04/15/2015 0939   BUN 9 03/18/2016 0914   BUN 6 04/15/2015 0939   CREATININE 0.60 03/18/2016 0914   CREATININE 0.67 04/15/2015 0939   CALCIUM 9.7 03/18/2016 0914   PROT 7.1 03/18/2016 0914   PROT 7.0 04/15/2015 0939   ALBUMIN 4.0 03/18/2016 0914   ALBUMIN 3.8 04/15/2015 0939   AST 22 03/18/2016 0914   ALT 17 03/18/2016 0914   ALKPHOS 110 03/18/2016 0914   BILITOT 0.5 03/18/2016  0914   BILITOT 0.3 04/15/2015 0939   GFRNONAA >89 03/18/2016 0914   GFRNONAA 126 04/15/2015 0939   GFRAA >89 03/18/2016 0914   GFRAA 145 04/15/2015 0939    Diabetic Labs (most recent): Lab Results  Component Value Date   HGBA1C 10.5* 03/18/2016   HGBA1C 12.3* 04/15/2015   HGBA1C 10.2 08/01/2014     Lipid Panel ( most recent) Lipid Panel     Component Value Date/Time   CHOL 153 03/18/2016 0914   CHOL 155 04/15/2015 0939   TRIG 153* 03/18/2016 0914   HDL 36* 03/18/2016 0914   HDL 32* 04/15/2015 0939   CHOLHDL 4.3 03/18/2016 0914   CHOLHDL 4.8* 04/15/2015 0939   VLDL 31* 03/18/2016 0914   LDLCALC 86 03/18/2016 0914   LDLCALC 95 04/15/2015 0939      Assessment & Plan:   1. Type 2 diabetes, uncontrolled, with neuropathy (HCC)  - Patient has currently uncontrolled symptomatic  type 2 DM since  20 years of age.   - Her repeat labs show A1c improved from 10.5% from 12.3%. She has normal renal function.  - Her diabetes is complicated by peripheral neuropathy, morbid obesity  and patient remains at a high risk for more acute and chronic complications of diabetes which include CAD, CVA, CKD, retinopathy, and neuropathy. These are all discussed in detail with the patient.  - I have counseled the patient on diet management and weight loss, by adopting a carbohydrate restricted/protein rich diet.  - Suggestion is made for patient to avoid simple carbohydrates   from their diet including Cakes , Desserts, Ice Cream,  Soda (  diet and regular) , Sweet Tea , Candies,  Chips, Cookies, Artificial Sweeteners,   and "Sugar-free" Products . This will help patient to have stable blood glucose profile and potentially avoid unintended weight gain.  - I encouraged the patient to switch to  unprocessed or minimally processed complex starch and increased protein intake (animal or plant source), fruits, and vegetables.  - Patient is advised to stick to a routine mealtimes to eat 3 meals  a day and avoid unnecessary snacks ( to snack only to correct hypoglycemia).  - The patient will be scheduled with Norm SaltPenny Crumpton, RDN, CDE for individualized DM education.  - I have approached patient with the following individualized plan to manage diabetes and patient agrees:   - She was responding traumatically for dietary modification until 2 days ago when she decided to reverse and her blood glucose remains significantly above target. -Emphasizing the importance of dietary modification, I approached her to stay on metformin 500 mg extended release once a day along with Tanzeum 50mg  SQ weekly, therapeutically suitable for patient. -She will be considered for insulin therapy if she loses control. However ideal treatment for her would be maximizing lifestyle modification along with metformin and  tanzeum.  - Patient specific target  A1c;  LDL, HDL, Triglycerides, and  Waist Circumference were discussed in detail.  2) BP/HTN: Controlled.  3) Lipids/HPL:  Controlled LDL 86 triglycerides 153 , she would not need to statin therapy for now.   4)  Weight/Diet: CDE Consult will be initiated , exercise, and detailed carbohydrates information provided. 5) vitamin D deficiency: New diagnosis. -Will initiate vitamin D 5000 units daily for the next 90 days. 6) Chronic Care/Health Maintenance:  -Patient is not  on ACEI/ARB and Statin medications, encouraged to see Ophthalmology, Podiatrist at least yearly or according to recommendations, and advised to   stay away from smoking.  I have recommended yearly flu vaccine and pneumonia vaccination at least every 5 years; moderate intensity exercise for up to 150 minutes weekly; and  sleep for at least 7 hours a day.  - 60 minutes of time was spent on the care of this patient , 50% of which was applied for counseling on diabetes complications and their preventions.  - Patient to bring meter and  blood glucose logs during their next visit.   - I advised patient to maintain close follow up with Lubertha South, MD for primary care needs.  Follow up plan: - Return in about 3 months (around 06/26/2016) for follow up with pre-visit labs.  Marquis Lunch, MD Phone: [phone]  Fax: [phone]   03/26/2016, 1:36 PM

## 2016-05-03 ENCOUNTER — Encounter: Payer: 59 | Attending: "Endocrinology | Admitting: Nutrition

## 2016-05-03 ENCOUNTER — Encounter: Payer: Self-pay | Admitting: Nutrition

## 2016-05-03 ENCOUNTER — Other Ambulatory Visit: Payer: Self-pay

## 2016-05-03 DIAGNOSIS — E1140 Type 2 diabetes mellitus with diabetic neuropathy, unspecified: Secondary | ICD-10-CM | POA: Insufficient documentation

## 2016-05-03 DIAGNOSIS — E1165 Type 2 diabetes mellitus with hyperglycemia: Secondary | ICD-10-CM | POA: Diagnosis not present

## 2016-05-03 DIAGNOSIS — Z713 Dietary counseling and surveillance: Secondary | ICD-10-CM | POA: Diagnosis not present

## 2016-05-03 DIAGNOSIS — E118 Type 2 diabetes mellitus with unspecified complications: Secondary | ICD-10-CM

## 2016-05-03 MED ORDER — GLUCOSE BLOOD VI STRP: ORAL_STRIP | Status: DC

## 2016-05-03 NOTE — Patient Instructions (Signed)
Goals 1. Follow My Plate 2. Avoid snacks between meals. 3. Cut out sodas, juices or tea, 4. Drink only water 5.Exercise 60 minutes 4 days per week. 7. Pick up strips and test twice a day. 8. Lose 1-2 lbs per week. 9. Get A1C down to 7%.

## 2016-05-03 NOTE — Progress Notes (Signed)
ADiabetes Self-Management Education  Visit Type: First/Initial  Appt. Start Time: 1330 Appt. End Time: 1430  05/04/2016  [name], identified by name and date of birth, is a 20 y.o. female with a diagnosis of Diabetes: Type 2. Assessment:  Primary concerns today: Diabetes Type 2. Lives with her Mom. Dx Dm x  5 years. Toujoe 50 units and 500 mg Metformin BID.  Never met with an RD before. Eats three meals per day.  Testing twice a day. FBS: 129-130's  Bedtime: 230-300' mg/dl. Diet is high in processed foods, concentrated sweets and a lot of high fat foods. Morbidly obese with BMI > 40. She is motivated to work on changing her eating habits and losing weight. Joined PF today and will commit to exercising 4 days per week.  ASSESSMENT  Height _0  (1.651 m), weight 347 lb (157.398 kg). Body mass index is 57.74 kg/(m^2).      Diabetes Self-Management Education - 05/03/16 1402    Visit Information   Visit Type First/Initial   Initial Visit   Diabetes Type Type 2   Health Coping   How would you rate your overall health? Poor   Psychosocial Assessment   Patient Belief/Attitude about Diabetes Motivated to manage diabetes   Self-care barriers None   Self-management support Doctor's office;Friends;Family   Other persons present Patient   Patient Concerns Nutrition/Meal planning;Healthy Lifestyle;Monitoring;Weight Control   Special Needs None   Preferred Learning Style No preference indicated   Learning Readiness Ready   How often do you need to have someone help you when you read instructions, pamphlets, or other written materials from your doctor or pharmacy? 1 - Never   What is the last grade level you completed in school? 12   Pre-Education Assessment   Patient understands the diabetes disease and treatment process. Needs Instruction   Patient understands incorporating nutritional management into lifestyle. Needs Instruction   Patient undertands incorporating physical  activity into lifestyle. Needs Instruction   Patient understands using medications safely. Needs Instruction   Patient understands monitoring blood glucose, interpreting and using results Needs Instruction   Patient understands prevention, detection, and treatment of acute complications. Needs Instruction   Patient understands prevention, detection, and treatment of chronic complications. Needs Instruction   Patient understands how to develop strategies to address psychosocial issues. Needs Instruction   Patient understands how to develop strategies to promote health/change behavior. Needs Instruction   Complications   Last HgB A1C per patient/outside source 10.5 %   How often do you check your blood sugar? 1-2 times/day   Fasting Blood glucose range (mg/dL) 130-179   Postprandial Blood glucose range (mg/dL) >200   Number of hypoglycemic episodes per month 0   Number of hyperglycemic episodes per week 5   Can you tell when your blood sugar is high? Yes   What do you do if your blood sugar is high? sleepy and head hursts and dizzy   Have you had a dilated eye exam in the past 12 months? No   Have you had a dental exam in the past 12 months? No   Are you checking your feet? Yes   How many days per week are you checking your feet? 7   Dietary Intake   Breakfast egg and toast and juice   Lunch chicken tenders, green beans and mashed potatoes,    Snack (afternoon) cheese its   Dinner meatloaf, string beans, mashed poatoes, sweet team and 2 slice pound cake   Beverage(s)  soda, tea or juice, water   Exercise   Exercise Type ADL's   Patient Education   Previous Diabetes Education No   Disease state  Definition of diabetes, type 1 and 2, and the diagnosis of diabetes;Explored patient's options for treatment of their diabetes      Individualized Plan for Diabetes Self-Management Training:   Learning Objective:  Patient will have a greater understanding of diabetes self-management. Patient  education plan is to attend individual and/or group sessions per assessed needs and concerns.   Plan:   Patient Instructions  Goals 1. Follow My Plate 2. Avoid snacks between meals. 3. Cut out sodas, juices or tea, 4. Drink only water 5.Exercise 60 minutes 4 days per week. 7. Pick up strips and test twice a day. 8. Lose 1-2 lbs per week. 9. Get A1C down to 7%.   Expected Outcomes:     Education material provided: Living Well with Diabetes, A1C conversion sheet and Meal plan card  If problems or questions, patient to contact team via:   Email and Phone  Future DSME appointment:   Follow up 1 month  Monitoring/Evaluation:  Dietary intake, exercise, meal planning, SBG, and body weight in 1 month(s).

## 2016-06-02 ENCOUNTER — Ambulatory Visit: Payer: 59 | Admitting: Nutrition

## 2016-06-29 ENCOUNTER — Ambulatory Visit: Payer: 59 | Admitting: "Endocrinology

## 2016-07-19 ENCOUNTER — Ambulatory Visit: Payer: 59 | Admitting: Family Medicine

## 2016-07-19 ENCOUNTER — Emergency Department (HOSPITAL_COMMUNITY): Payer: 59

## 2016-07-19 ENCOUNTER — Encounter (HOSPITAL_COMMUNITY): Payer: Self-pay | Admitting: Emergency Medicine

## 2016-07-19 ENCOUNTER — Emergency Department (HOSPITAL_COMMUNITY)
Admission: EM | Admit: 2016-07-19 | Discharge: 2016-07-19 | Disposition: A | Discharge status: Home / Self Care | Payer: 59 | Attending: Emergency Medicine | Admitting: Emergency Medicine

## 2016-07-19 DIAGNOSIS — E119 Type 2 diabetes mellitus without complications: Secondary | ICD-10-CM | POA: Insufficient documentation

## 2016-07-19 DIAGNOSIS — F909 Attention-deficit hyperactivity disorder, unspecified type: Secondary | ICD-10-CM | POA: Insufficient documentation

## 2016-07-19 DIAGNOSIS — X501XXA Overexertion from prolonged static or awkward postures, initial encounter: Secondary | ICD-10-CM | POA: Diagnosis not present

## 2016-07-19 DIAGNOSIS — I10 Essential (primary) hypertension: Secondary | ICD-10-CM | POA: Insufficient documentation

## 2016-07-19 DIAGNOSIS — Z7984 Long term (current) use of oral hypoglycemic drugs: Secondary | ICD-10-CM | POA: Insufficient documentation

## 2016-07-19 DIAGNOSIS — J45909 Unspecified asthma, uncomplicated: Secondary | ICD-10-CM | POA: Insufficient documentation

## 2016-07-19 DIAGNOSIS — Z79899 Other long term (current) drug therapy: Secondary | ICD-10-CM | POA: Diagnosis not present

## 2016-07-19 DIAGNOSIS — Y999 Unspecified external cause status: Secondary | ICD-10-CM | POA: Insufficient documentation

## 2016-07-19 DIAGNOSIS — Y929 Unspecified place or not applicable: Secondary | ICD-10-CM | POA: Diagnosis not present

## 2016-07-19 DIAGNOSIS — Y9301 Activity, walking, marching and hiking: Secondary | ICD-10-CM | POA: Diagnosis not present

## 2016-07-19 DIAGNOSIS — Z91010 Allergy to peanuts: Secondary | ICD-10-CM | POA: Diagnosis not present

## 2016-07-19 DIAGNOSIS — S76112A Strain of left quadriceps muscle, fascia and tendon, initial encounter: Secondary | ICD-10-CM | POA: Insufficient documentation

## 2016-07-19 DIAGNOSIS — S79922A Unspecified injury of left thigh, initial encounter: Secondary | ICD-10-CM | POA: Diagnosis present

## 2016-07-19 DIAGNOSIS — Z794 Long term (current) use of insulin: Secondary | ICD-10-CM | POA: Insufficient documentation

## 2016-07-19 LAB — POC URINE PREG, ED: PREG TEST UR: NEGATIVE

## 2016-07-19 MED ORDER — IBUPROFEN 800 MG PO TABS
800.0000 mg | ORAL_TABLET | Freq: Once | ORAL | Status: AC
  Administered 2016-07-19: 800 mg via ORAL
  Filled 2016-07-19: qty 1

## 2016-07-19 MED ORDER — HYDROCODONE-ACETAMINOPHEN 5-325 MG PO TABS: 1.0000 | ORAL_TABLET | Freq: Four times a day (QID) | ORAL | 0 refills | Status: DC | PRN

## 2016-07-19 MED ORDER — HYDROCODONE-ACETAMINOPHEN 5-325 MG PO TABS
1.0000 | ORAL_TABLET | Freq: Once | ORAL | Status: AC
  Administered 2016-07-19: 1 via ORAL
  Filled 2016-07-19: qty 1

## 2016-07-19 MED ORDER — IBUPROFEN 800 MG PO TABS: 800.0000 mg | ORAL_TABLET | Freq: Three times a day (TID) | ORAL | 0 refills | Status: DC

## 2016-07-19 NOTE — ED Triage Notes (Signed)
Pt states she took diclofenac PTA.

## 2016-07-19 NOTE — Discharge Instructions (Signed)
Apply ice as discussed for the next 2 days.  You may start using heat therapy starting on Wednesday.  You may take the hydrocodone prescribed for pain relief.  This will make you drowsy - do not drive within 4 hours of taking this medication.

## 2016-07-19 NOTE — ED Triage Notes (Signed)
Pt stepped down on steps yesterday, reports "pop" in her L leg. Pt states today she can't walk on the leg and is having difficulty with mvmt. Pt also reports bilat side pain and soreness in her arms.

## 2016-07-19 NOTE — ED Provider Notes (Deleted)
[facility] DEPT Provider Note   CSN: [id number] Arrival date & time: 07/19/16  1044     History   Chief Complaint Chief Complaint  Patient presents with  . Leg Pain    HPI HPI Comments: Michelle Tate is a 20 y.o. female who presents to the Emergency Department complaining of pain in left anterior upper thigh area since yesterday.  She describes walking down a few steps and when she used her left leg to advance to the next step she felt and heard a loud popping sensation in her upper thigh.  She has tried ice and used tylenol without improvement.  She rested yesterday and when she tried to get up today her pain was worse.  She has found no alleviators.  She also notices pain in her left foot since waking today.  She denies weakness or numbness in the extremity and denies other recent or distant injury of the leg.  She works in [employer] and is on her feet for long periods of time.   The history is provided by the patient. No language interpreter was used.    Past Medical History:  Diagnosis Date  . ADHD (attention deficit hyperactivity disorder)   . Allergy   . Asthma   . Bronchitis   . Chronic headache   . Diabetes mellitus without complication (HCC)   . Hyperinsulinemia   . Hypertension   . Irregular bleeding 02/27/2013  . Morbid obesity (HCC)   . Obesity 02/27/2013    Patient Active Problem List   Diagnosis Date Noted  . Vitamin D deficiency 03/26/2016  . Vulvar irritation 03/31/2015  . DUB (dysfunctional uterine bleeding) 02/22/2014  . Irregular bleeding 02/27/2013  . Morbid obesity (HCC) 02/27/2013  . Asthma 02/26/2013  . 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    Past Surgical History:  Procedure Laterality Date  . implanon placement    . TONSILLECTOMY    . WISDOM TOOTH EXTRACTION      OB History    No data available       Home Medications    Prior to Admission medications   Medication Sig Start Date End Date Taking?  Authorizing Provider  acetaminophen (TYLENOL) 325 MG tablet Take 325 mg by mouth every 6 (six) hours as needed.    Historical Provider, MD  Albiglutide (TANZEUM) 50 MG PEN Inject 50 mg subcu once a week 05/20/15   Campbell Richesarolyn C Hoskins, NP  albuterol (PROVENTIL HFA;VENTOLIN HFA) 108 (90 Base) MCG/ACT inhaler Inhale 2 puffs into the lungs every 4 (four) hours as needed for wheezing or shortness of breath. 12/17/15   Campbell Richesarolyn C Hoskins, NP  BIOTIN PO Take 1 tablet by mouth daily.    Historical Provider, MD  Cholecalciferol (VITAMIN D3) 5000 units CAPS Take 1 capsule (5,000 Units total) by mouth daily. 03/26/16   Roma KayserGebreselassie W Nida, MD  glucose blood (BAYER CONTOUR NEXT TEST) test strip Use as instructed 05/03/16   Roma KayserGebreselassie W Nida, MD  HYDROcodone-acetaminophen (NORCO/VICODIN) 5-325 MG tablet Take 1 tablet by mouth every 6 (six) hours as needed. 07/19/16   Burgess AmorJulie Milley Vining, PA-C  ibuprofen (ADVIL,MOTRIN) 800 MG tablet Take 1 tablet (800 mg total) by mouth 3 (three) times daily. 07/19/16   Burgess AmorJulie Trevious Rampey, PA-C  Insulin Glargine (TOUJEO SOLOSTAR) 300 UNIT/ML SOPN Inject 20 Units into the skin daily. 05/20/15   Campbell Richesarolyn C Hoskins, NP  Insulin Pen Needle (PEN NEEDLES 31GX5/16") 31G X 8 MM MISC Use BID as directed. 05/20/15   Eber Jonesarolyn  Bobette Mo, NP  metFORMIN (GLUCOPHAGE XR) 500 MG 24 hr tablet Take 1 tablet (500 mg total) by mouth daily after supper. 03/26/16   Roma Kayser, MD  norgestimate-ethinyl estradiol (ORTHO-CYCLEN,SPRINTEC,PREVIFEM) 0.25-35 MG-MCG tablet Take 1 tablet by mouth daily. 10/09/15   Jacklyn Shell, CNM    Family History Family History  Problem Relation Age of Onset  . Diabetes Paternal Grandmother   . Hypertension Paternal Grandmother   . Hypertension Maternal Grandmother   . Heart disease Father   . Hypertension Father   . Diabetes Father   . Seizures Mother   . Stroke Other   . Cancer Other   . Thyroid disease Cousin     Social History Social History  Substance Use Topics  .  Smoking status: Never Smoker  . Smokeless tobacco: Never Used  . Alcohol use 0.0 oz/week     Comment: occ     Allergies   Banana; Peanut-containing drug products; Penicillins; Sulfa drugs cross reactors; and Vanilla   Review of Systems Review of Systems  Constitutional: Negative for fever.  Musculoskeletal: Positive for arthralgias. Negative for joint swelling and myalgias.  Neurological: Negative for weakness and numbness.     Physical Exam Updated Vital Signs BP 134/85 (BP Location: Left Arm)   Pulse 100   Temp 99 F (37.2 C) (Oral)   Resp 20   Ht 5\' 6"  (1.676 m)   Wt (!) 145.2 kg   LMP 07/18/2016   SpO2 100%   BMI 51.65 kg/m   Physical Exam  Constitutional: She appears well-developed and well-nourished.  HENT:  Head: Atraumatic.  Neck: Normal range of motion.  Cardiovascular:  Pulses:      Dorsalis pedis pulses are 2+ on the right side, and 2+ on the left side.  Pulses equal bilaterally  Musculoskeletal: She exhibits tenderness.       Left hip: She exhibits tenderness. She exhibits no bony tenderness, no swelling and no crepitus.  ttp anterior upper to mid thigh without palpable deformity.  She can SLR keeping her knee locked.  No patellar or knee pain, quadricep tendon intact.  Pain worsened with active but not passive hip flexion.   Neurological: She is alert. She has normal strength. She displays normal reflexes. No sensory deficit.  Skin: Skin is warm and dry.  Psychiatric: She has a normal mood and affect.     ED Treatments / Results  Labs (all labs ordered are listed, but only abnormal results are displayed) Labs Reviewed  POC URINE PREG, ED    EKG  EKG Interpretation None       Radiology Dg Foot Complete Left  Result Date: 07/19/2016 CLINICAL DATA:  Injured stepping off of a stool.  Foot pain. EXAM: LEFT FOOT - COMPLETE 3+ VIEW COMPARISON:  None. FINDINGS: There is no evidence of fracture or dislocation. There is no evidence of arthropathy  or other focal bone abnormality. Soft tissues are unremarkable. IMPRESSION: Negative. Electronically Signed   By: Paulina Fusi M.D.   On: 07/19/2016 12:24   Dg Hip Unilat W Or Wo Pelvis 2-3 Views Left  Result Date: 07/19/2016 CLINICAL DATA:  Patient stepped of a stool and heard pop of left hip with pain. No hx. EXAM: DG HIP (WITH OR WITHOUT PELVIS) 2-3V LEFT COMPARISON:  None. FINDINGS: There is no evidence of hip fracture or dislocation. There is no evidence of arthropathy or other focal bone abnormality. IMPRESSION: Negative. Electronically Signed   By: Corlis Leak M.D.   On:  07/19/2016 11:23    Procedures Procedures  DIAGNOSTIC STUDIES: Oxygen Saturation is 100% on RA, normal by my interpretation.    COORDINATION OF CARE: 8:19 AM Discussed next steps with pt. Pt verbalized understanding and is agreeable with the plan.    Medications Ordered in ED Medications  ibuprofen (ADVIL,MOTRIN) tablet 800 mg (800 mg Oral Given 07/19/16 1342)  HYDROcodone-acetaminophen (NORCO/VICODIN) 5-325 MG per tablet 1 tablet (1 tablet Oral Given 07/19/16 1342)     Initial Impression / Assessment and Plan / ED Course  I have reviewed the triage vital signs and the nursing notes.  Pertinent labs & imaging results that were available during my care of the patient were reviewed by me and considered in my medical decision making (see chart for details).  Clinical Course    Images reviewed. Hydrocodone prescribed, also discussed ice tx, heat tx.  Activity as tolerated.  Plan f/u with pcp if sx persist or are not improving over the next week.   Final Clinical Impressions(s) / ED Diagnoses   Final diagnoses:  Quadriceps strain, left, initial encounter    New Prescriptions Discharge Medication List as of 07/19/2016  1:32 PM    START taking these medications   Details  HYDROcodone-acetaminophen (NORCO/VICODIN) 5-325 MG tablet Take 1 tablet by mouth every 6 (six) hours as needed., Starting Mon 07/19/2016,  Print         Burgess Amor, PA-C 07/20/16 [phone]

## 2016-07-19 NOTE — ED Notes (Signed)
X-ray informed pt needed a urine specimen prior to x-ray. X-ray tech states pt is currently on her period and will shield pt for foot x-ray.

## 2016-07-20 NOTE — ED Provider Notes (Signed)
[facility] Provider Note   CSN: [id number] Arrival date & time: 07/19/16  1044     History   Chief Complaint Chief Complaint  Patient presents with  . Leg Pain    HPI Michelle Tate is a 20 y.o. female who presents to the Emergency Department complaining of pain in left anterior upper thigh area since yesterday.  She describes walking down a few steps and when she used her left leg to advance to the next step she felt and heard a loud popping sensation in her upper thigh.  She has tried ice and used tylenol without improvement.  She rested yesterday and when she tried to get up today her pain was worse.  She has found no alleviators.  She also notices pain in her left foot since waking today.  She denies weakness or numbness in the extremity and denies other recent or distant injury of the leg.  She works in [employer] ge[employer] and is on her feet for long periods of time.  The history is provided by the patient.    Past Medical History:  Diagnosis Date  . ADHD (attention deficit hyperactivity disorder)   . Allergy   . Asthma   . Bronchitis   . Chronic headache   . Diabetes mellitus without complication (HCC)   . Hyperinsulinemia   . Hypertension   . Irregular bleeding 02/27/2013  . Morbid obesity (HCC)   . Obesity 02/27/2013    Patient Active Problem List   Diagnosis Date Noted  . Vitamin D deficiency 03/26/2016  . Vulvar irritation 03/31/2015  . DUB (dysfunctional uterine bleeding) 02/22/2014  . Irregular bleeding 02/27/2013  . Morbid obesity (HCC) 02/27/2013  . Asthma 02/26/2013  . 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    Past Surgical History:  Procedure Laterality Date  . implanon placement    . TONSILLECTOMY    . WISDOM TOOTH EXTRACTION      OB History    No data available       Home Medications    Prior to Admission medications   Medication Sig Start Date End Date Taking? Authorizing Provider  acetaminophen (TYLENOL) 325 MG  tablet Take 325 mg by mouth every 6 (six) hours as needed.    Historical Provider, MD  Albiglutide (TANZEUM) 50 MG PEN Inject 50 mg subcu once a week 05/20/15   Campbell Riches, NP  albuterol (PROVENTIL HFA;VENTOLIN HFA) 108 (90 Base) MCG/ACT inhaler Inhale 2 puffs into the lungs every 4 (four) hours as needed for wheezing or shortness of breath. 12/17/15   Campbell Riches, NP  BIOTIN PO Take 1 tablet by mouth daily.    Historical Provider, MD  Cholecalciferol (VITAMIN D3) 5000 units CAPS Take 1 capsule (5,000 Units total) by mouth daily. 03/26/16   Roma Kayser, MD  glucose blood (BAYER CONTOUR NEXT TEST) test strip Use as instructed 05/03/16   Roma Kayser, MD  HYDROcodone-acetaminophen (NORCO/VICODIN) 5-325 MG tablet Take 1 tablet by mouth every 6 (six) hours as needed. 07/19/16   Burgess Amor, PA-C  ibuprofen (ADVIL,MOTRIN) 800 MG tablet Take 1 tablet (800 mg total) by mouth 3 (three) times daily. 07/19/16   Burgess Amor, PA-C  Insulin Glargine (TOUJEO SOLOSTAR) 300 UNIT/ML SOPN Inject 20 Units into the skin daily. 05/20/15   Campbell Riches, NP  Insulin Pen Needle (PEN NEEDLES 31GX5/16") 31G X 8 MM MISC Use BID as directed. 05/20/15   Campbell Riches, NP  metFORMIN (GLUCOPHAGE XR) 500  MG 24 hr tablet Take 1 tablet (500 mg total) by mouth daily after supper. 03/26/16   Roma Kayser, MD  norgestimate-ethinyl estradiol (ORTHO-CYCLEN,SPRINTEC,PREVIFEM) 0.25-35 MG-MCG tablet Take 1 tablet by mouth daily. 10/09/15   Jacklyn Shell, CNM    Family History Family History  Problem Relation Age of Onset  . Diabetes Paternal Grandmother   . Hypertension Paternal Grandmother   . Hypertension Maternal Grandmother   . Heart disease Father   . Hypertension Father   . Diabetes Father   . Seizures Mother   . Stroke Other   . Cancer Other   . Thyroid disease Cousin     Social History Social History  Substance Use Topics  . Smoking status: Never Smoker  . Smokeless tobacco:  Never Used  . Alcohol use 0.0 oz/week     Comment: occ     Allergies   Banana; Peanut-containing drug products; Penicillins; Sulfa drugs cross reactors; and Vanilla   Review of Systems Review of Systems  Constitutional: Negative for fever.  Musculoskeletal: Positive for arthralgias. Negative for joint swelling and myalgias.  Neurological: Negative for weakness and numbness.     Physical Exam Updated Vital Signs BP 134/85 (BP Location: Left Arm)   Pulse 100   Temp 99 F (37.2 C) (Oral)   Resp 20   Ht 5\' 6"  (1.676 m)   Wt (!) 145.2 kg   LMP 07/18/2016   SpO2 100%   BMI 51.65 kg/m   Physical Exam  Constitutional: She appears well-developed and well-nourished.  HENT:  Head: Atraumatic.  Neck: Normal range of motion.  Cardiovascular:  Pulses equal bilaterally  Musculoskeletal: She exhibits tenderness.       Left hip: She exhibits tenderness. She exhibits no swelling, no crepitus and no deformity.  ttp anterior upper to mid thigh without palpable deformity.  She can SLR keeping her knee locked.  No patellar or knee pain, quadricep tendon intact.  Pain worsened with active but not passive hip flexion.    Neurological: She is alert. She has normal strength. She displays normal reflexes. No sensory deficit.  Skin: Skin is warm and dry.  Psychiatric: She has a normal mood and affect.     ED Treatments / Results  Labs (all labs ordered are listed, but only abnormal results are displayed) Labs Reviewed  POC URINE PREG, ED    EKG  EKG Interpretation None       Radiology Dg Foot Complete Left  Result Date: 07/19/2016 CLINICAL DATA:  Injured stepping off of a stool.  Foot pain. EXAM: LEFT FOOT - COMPLETE 3+ VIEW COMPARISON:  None. FINDINGS: There is no evidence of fracture or dislocation. There is no evidence of arthropathy or other focal bone abnormality. Soft tissues are unremarkable. IMPRESSION: Negative. Electronically Signed   By: Paulina Fusi M.D.   On:  07/19/2016 12:24   Dg Hip Unilat W Or Wo Pelvis 2-3 Views Left  Result Date: 07/19/2016 CLINICAL DATA:  Patient stepped of a stool and heard pop of left hip with pain. No hx. EXAM: DG HIP (WITH OR WITHOUT PELVIS) 2-3V LEFT COMPARISON:  None. FINDINGS: There is no evidence of hip fracture or dislocation. There is no evidence of arthropathy or other focal bone abnormality. IMPRESSION: Negative. Electronically Signed   By: Corlis Leak M.D.   On: 07/19/2016 11:23    Procedures Procedures (including critical care time)  Medications Ordered in ED Medications  ibuprofen (ADVIL,MOTRIN) tablet 800 mg (800 mg Oral Given 07/19/16 1342)  HYDROcodone-acetaminophen (NORCO/VICODIN) 5-325 MG per tablet 1 tablet (1 tablet Oral Given 07/19/16 1342)     Initial Impression / Assessment and Plan / ED Course  I have reviewed the triage vital signs and the nursing notes.  Pertinent labs & imaging results that were available during my care of the patient were reviewed by me and considered in my medical decision making (see chart for details).  Clinical Course    Images reviewed. Hydrocodone prescribed, also discussed ice tx, heat tx.  Activity as tolerated.  Plan f/u with pcp if sx persist or are not improving over the next week.  Final Clinical Impressions(s) / ED Diagnoses   Final diagnoses:  Quadriceps strain, left, initial encounter    New Prescriptions Discharge Medication List as of 07/19/2016  1:32 PM    START taking these medications   Details  HYDROcodone-acetaminophen (NORCO/VICODIN) 5-325 MG tablet Take 1 tablet by mouth every 6 (six) hours as needed., Starting Mon 07/19/2016, Print         Burgess AmorJulie Youlanda Tomassetti, PA-C 07/20/16 [phone]    Blane OharaJoshua Zavitz, MD 07/20/16 1531

## 2016-08-03 ENCOUNTER — Telehealth: Payer: Self-pay | Admitting: Family Medicine

## 2016-08-03 DIAGNOSIS — Z719 Counseling, unspecified: Secondary | ICD-10-CM

## 2016-08-03 NOTE — Telephone Encounter (Signed)
Mom called requesting a referral to [facility] for counseling for the patient  If ok to refer, please initiate in system so that I may process   (we do not have a DPR on file, will have to speak with pt only)

## 2016-08-04 NOTE — Telephone Encounter (Signed)
Referral ordered in epic 

## 2016-08-04 NOTE — Telephone Encounter (Signed)
Let's do 

## 2016-08-10 ENCOUNTER — Encounter (HOSPITAL_COMMUNITY): Payer: Self-pay | Admitting: Emergency Medicine

## 2016-08-10 ENCOUNTER — Emergency Department (HOSPITAL_COMMUNITY)
Admission: EM | Admit: 2016-08-10 | Discharge: 2016-08-10 | Disposition: A | Discharge status: Home / Self Care | Payer: 59 | Attending: Emergency Medicine | Admitting: Emergency Medicine

## 2016-08-10 ENCOUNTER — Telehealth: Payer: Self-pay | Admitting: Family Medicine

## 2016-08-10 DIAGNOSIS — Z7984 Long term (current) use of oral hypoglycemic drugs: Secondary | ICD-10-CM | POA: Diagnosis not present

## 2016-08-10 DIAGNOSIS — J45909 Unspecified asthma, uncomplicated: Secondary | ICD-10-CM | POA: Diagnosis not present

## 2016-08-10 DIAGNOSIS — R45851 Suicidal ideations: Secondary | ICD-10-CM

## 2016-08-10 DIAGNOSIS — N898 Other specified noninflammatory disorders of vagina: Secondary | ICD-10-CM | POA: Insufficient documentation

## 2016-08-10 DIAGNOSIS — E119 Type 2 diabetes mellitus without complications: Secondary | ICD-10-CM | POA: Diagnosis not present

## 2016-08-10 DIAGNOSIS — F909 Attention-deficit hyperactivity disorder, unspecified type: Secondary | ICD-10-CM | POA: Diagnosis not present

## 2016-08-10 DIAGNOSIS — Z79899 Other long term (current) drug therapy: Secondary | ICD-10-CM | POA: Diagnosis not present

## 2016-08-10 DIAGNOSIS — F4312 Post-traumatic stress disorder, chronic: Secondary | ICD-10-CM | POA: Diagnosis not present

## 2016-08-10 DIAGNOSIS — F329 Major depressive disorder, single episode, unspecified: Secondary | ICD-10-CM | POA: Insufficient documentation

## 2016-08-10 LAB — RAPID URINE DRUG SCREEN, HOSP PERFORMED
AMPHETAMINES: NOT DETECTED
BARBITURATES: NOT DETECTED
BENZODIAZEPINES: NOT DETECTED
COCAINE: POSITIVE — AB
Opiates: NOT DETECTED
Tetrahydrocannabinol: POSITIVE — AB

## 2016-08-10 LAB — COMPREHENSIVE METABOLIC PANEL
ALBUMIN: 3.5 g/dL (ref 3.5–5.0)
ALT: 14 U/L (ref 14–54)
ANION GAP: 8 (ref 5–15)
AST: 24 U/L (ref 15–41)
Alkaline Phosphatase: 87 U/L (ref 38–126)
BILIRUBIN TOTAL: 0.4 mg/dL (ref 0.3–1.2)
BUN: 8 mg/dL (ref 6–20)
CHLORIDE: 102 mmol/L (ref 101–111)
CO2: 25 mmol/L (ref 22–32)
Calcium: 9.2 mg/dL (ref 8.9–10.3)
Creatinine, Ser: 0.76 mg/dL (ref 0.44–1.00)
GFR calc Af Amer: >60 mL/min (ref >60)
GFR calc non Af Amer: >60 mL/min (ref >60)
GLUCOSE: 208 mg/dL — AB (ref 65–99)
POTASSIUM: 3.5 mmol/L (ref 3.5–5.1)
SODIUM: 135 mmol/L (ref 135–145)
Total Protein: 7.6 g/dL (ref 6.5–8.1)

## 2016-08-10 LAB — CBC
HEMATOCRIT: 39.2 % (ref 36.0–46.0)
HEMOGLOBIN: 12.7 g/dL (ref 12.0–15.0)
MCH: 25 pg — ABNORMAL LOW (ref 26.0–34.0)
MCHC: 32.4 g/dL (ref 30.0–36.0)
MCV: 77.3 fL — AB (ref 78.0–100.0)
Platelets: 430 10*3/uL — ABNORMAL HIGH (ref 150–400)
RBC: 5.07 MIL/uL (ref 3.87–5.11)
RDW: 13.7 % (ref 11.5–15.5)
WBC: 10.4 10*3/uL (ref 4.0–10.5)

## 2016-08-10 LAB — ETHANOL: Alcohol, Ethyl (B): 9 mg/dL — ABNORMAL HIGH (ref <5)

## 2016-08-10 LAB — SALICYLATE LEVEL: Salicylate Lvl: <7 mg/dL (ref 2.8–30.0)

## 2016-08-10 LAB — HCG, QUANTITATIVE, PREGNANCY

## 2016-08-10 LAB — ACETAMINOPHEN LEVEL

## 2016-08-10 NOTE — ED Notes (Signed)
EDP notified that the patient can be d/c as she does not meet inpatient criteria and she agrees to f/u with her own provider tomorrow.

## 2016-08-10 NOTE — ED Notes (Signed)
EDP Tegeler made aware of pt BP, instructed to pt to f/u with her PCP about the same. Pt verbalized understanding of calling 911 or return to ED if thoughts of SI return.

## 2016-08-10 NOTE — Telephone Encounter (Signed)
Mom called to check status of mental health referral Explained that I hadn't quite go to it yet but would today Mom states pt said something to her boyfriend about hurting herself I immediately told mom that the pt needs to go directly to the nearest ER for an eval by the ACT team, told mom do not wait on a referral when a patient makes those very serious statements Also explained to mom that in order to discuss anything with her the patient must sign giving us permission to do so and that the behavioral health office will only speak with the patient to schedule due to the patient is an adult and not having signed permission to speak to anyone else Mom states she will let patient know
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
## 2016-08-10 NOTE — ED Notes (Signed)
[facility]

## 2016-08-10 NOTE — Discharge Instructions (Signed)
Please follow-up with the psychiatry team as instructed tomorrow. If symptoms return or worsen, please return to the nearest emergency department.

## 2016-08-10 NOTE — Telephone Encounter (Signed)
Referral was faxed to [facility] [place] today abouth 11:55am

## 2016-08-10 NOTE — Telephone Encounter (Signed)
Mom called back this time with the patient on speaker phone, patient states she called [facility] & was told we need to send referral Explained to patient that I did send referral right before lunch and they have what they need from us Patient then stated that she's thinking of hurting herself, I immediately advised that she go directly to the nearest ER for ACT team eval, patient asked again if that's what she should do and I explained that yes she needs to go to ER and they will get her eval'd and set up with someone in behavioral health  Pt verbalized understanding and states she will go to ER

## 2016-08-10 NOTE — ED Triage Notes (Signed)
Pt states she has a lot of things going on in her life and feels like she needs to talk to somebody. Pt reports SI but denies having a plan.

## 2016-08-11 NOTE — Telephone Encounter (Signed)
Mom called this morning with patient on speaker phone, states that they did go to ER & the ER doc recommended they call behavioral health to get an appointment, patient states she called and they apparently did not get the fax referral that was sent yesterday, so I faxed it again and sent it inner office mail.   I also called & confirmed with Ruby (recptionist) that it was received and she states it is in the doctors box

## 2016-08-17 ENCOUNTER — Ambulatory Visit (INDEPENDENT_AMBULATORY_CARE_PROVIDER_SITE_OTHER): Payer: 59 | Admitting: Psychiatry

## 2016-08-17 ENCOUNTER — Encounter (HOSPITAL_COMMUNITY): Payer: Self-pay | Admitting: Psychiatry

## 2016-08-17 VITALS — BP 145/103 | HR 93 | Ht 65.5 in | Wt 340.0 lb

## 2016-08-17 DIAGNOSIS — F4323 Adjustment disorder with mixed anxiety and depressed mood: Secondary | ICD-10-CM

## 2016-08-17 DIAGNOSIS — Z8249 Family history of ischemic heart disease and other diseases of the circulatory system: Secondary | ICD-10-CM | POA: Diagnosis not present

## 2016-08-17 DIAGNOSIS — Z833 Family history of diabetes mellitus: Secondary | ICD-10-CM | POA: Diagnosis not present

## 2016-08-17 DIAGNOSIS — Z888 Allergy status to other drugs, medicaments and biological substances status: Secondary | ICD-10-CM

## 2016-08-17 DIAGNOSIS — Z880 Allergy status to penicillin: Secondary | ICD-10-CM

## 2016-08-17 DIAGNOSIS — Z823 Family history of stroke: Secondary | ICD-10-CM | POA: Diagnosis not present

## 2016-08-17 DIAGNOSIS — Z808 Family history of malignant neoplasm of other organs or systems: Secondary | ICD-10-CM

## 2016-08-17 NOTE — Patient Instructions (Addendum)
1. Will make a referral for therapist 2. Call this clinic for appointment as needed
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
## 2016-09-01 ENCOUNTER — Ambulatory Visit (HOSPITAL_COMMUNITY): Payer: 59 | Admitting: Psychiatry

## 2016-09-02 ENCOUNTER — Ambulatory Visit (INDEPENDENT_AMBULATORY_CARE_PROVIDER_SITE_OTHER): Payer: 59 | Admitting: Nurse Practitioner

## 2016-09-02 ENCOUNTER — Encounter: Payer: Self-pay | Admitting: Nurse Practitioner

## 2016-09-02 VITALS — BP 128/82 | Temp 98.5°F | Ht 64.0 in | Wt 338.8 lb

## 2016-09-02 DIAGNOSIS — H66002 Acute suppurative otitis media without spontaneous rupture of ear drum, left ear: Secondary | ICD-10-CM

## 2016-09-02 DIAGNOSIS — J310 Chronic rhinitis: Secondary | ICD-10-CM

## 2016-09-02 DIAGNOSIS — J329 Chronic sinusitis, unspecified: Secondary | ICD-10-CM | POA: Diagnosis not present

## 2016-09-02 DIAGNOSIS — J029 Acute pharyngitis, unspecified: Secondary | ICD-10-CM | POA: Diagnosis not present

## 2016-09-02 MED ORDER — NEOMYCIN-POLYMYXIN-HC 3.5-10000-1 OT SOLN: 4.0000 [drp] | Freq: Four times a day (QID) | OTIC | 0 refills | Status: DC

## 2016-09-02 MED ORDER — CEFDINIR 300 MG PO CAPS: 300.0000 mg | ORAL_CAPSULE | Freq: Two times a day (BID) | ORAL | 0 refills | Status: DC

## 2016-09-06 ENCOUNTER — Encounter: Payer: Self-pay | Admitting: Nurse Practitioner

## 2016-09-06 NOTE — Progress Notes (Signed)
Subjective:  Presents for c/o left ear pain for about a week. Has tried sweet oil with minimal improvement. No drainage. No fever. Sore throat, headache, cough. No wheezing. No V/D or abdominal pain. Taking fluids well. Voiding nl.   Objective:   BP 128/82   Temp 98.5 F (36.9 C) (Oral)   Ht 5\' 4"  (1.626 m)   Wt (!) 338 lb 12.8 oz (153.7 kg)   LMP 08/08/2016 (Exact Date)   BMI 58.15 kg/m  NAD. Alert, oriented. Rt TM: clear effusion. Lt external canal: no drainage or swelling. LT TM: dull with erythema. Minimal tenderness with movement of pinna. Pharynx mild erythema with PND noted. Neck supple with mild anterior adenopathy. Lungs clear. Heart RRR.   Assessment: Acute pharyngitis, unspecified etiology  Acute suppurative otitis media of left ear without spontaneous rupture of tympanic membrane, recurrence not specified  Rhinosinusitis  Plan:  Meds ordered this encounter  Medications  . cefdinir (OMNICEF) 300 MG capsule    Sig: Take 1 capsule (300 mg total) by mouth 2 (two) times daily.    Dispense:  20 capsule    Refill:  0    Order Specific Question:   Supervising Provider    Answer:   Merlyn AlbertLUKING, WILLIAM S [[id number]]  . neomycin-polymyxin-hydrocortisone (CORTISPORIN) otic solution    Sig: Place 4 drops into the left ear 4 (four) times daily.    Dispense:  10 mL    Refill:  0    Order Specific Question:   Supervising Provider    Answer:   Merlyn AlbertLUKING, WILLIAM S [[id number]]   OTC meds as directed for pain and congestion. Call back next week if no improvement, sooner if worse.
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
## 2016-09-14 ENCOUNTER — Encounter (HOSPITAL_COMMUNITY): Payer: Self-pay | Admitting: Psychiatry

## 2016-09-14 ENCOUNTER — Ambulatory Visit (INDEPENDENT_AMBULATORY_CARE_PROVIDER_SITE_OTHER): Payer: 59 | Admitting: Psychiatry

## 2016-09-14 VITALS — BP 147/123 | HR 101 | Ht 64.0 in | Wt 334.2 lb

## 2016-09-14 DIAGNOSIS — Z8489 Family history of other specified conditions: Secondary | ICD-10-CM

## 2016-09-14 DIAGNOSIS — Z8249 Family history of ischemic heart disease and other diseases of the circulatory system: Secondary | ICD-10-CM

## 2016-09-14 DIAGNOSIS — Z91010 Allergy to peanuts: Secondary | ICD-10-CM

## 2016-09-14 DIAGNOSIS — Z888 Allergy status to other drugs, medicaments and biological substances status: Secondary | ICD-10-CM

## 2016-09-14 DIAGNOSIS — Z833 Family history of diabetes mellitus: Secondary | ICD-10-CM

## 2016-09-14 DIAGNOSIS — F4322 Adjustment disorder with anxiety: Secondary | ICD-10-CM | POA: Diagnosis not present

## 2016-09-14 DIAGNOSIS — Z79899 Other long term (current) drug therapy: Secondary | ICD-10-CM

## 2016-09-14 DIAGNOSIS — Z8349 Family history of other endocrine, nutritional and metabolic diseases: Secondary | ICD-10-CM

## 2016-09-14 DIAGNOSIS — Z823 Family history of stroke: Secondary | ICD-10-CM

## 2016-09-14 DIAGNOSIS — Z91018 Allergy to other foods: Secondary | ICD-10-CM

## 2016-09-14 DIAGNOSIS — Z882 Allergy status to sulfonamides status: Secondary | ICD-10-CM

## 2016-09-14 NOTE — Patient Instructions (Signed)
1. Return to clinic as needed 2. You will see a therapist

## 2016-09-21 ENCOUNTER — Encounter (HOSPITAL_COMMUNITY): Payer: Self-pay | Admitting: *Deleted

## 2016-09-21 ENCOUNTER — Encounter (HOSPITAL_COMMUNITY): Payer: Self-pay | Admitting: Psychiatry

## 2016-09-21 ENCOUNTER — Ambulatory Visit (INDEPENDENT_AMBULATORY_CARE_PROVIDER_SITE_OTHER): Payer: 59 | Admitting: Psychiatry

## 2016-09-21 ENCOUNTER — Emergency Department (HOSPITAL_COMMUNITY)
Admission: EM | Admit: 2016-09-21 | Discharge: 2016-09-21 | Disposition: A | Discharge status: Home / Self Care | Payer: 59 | Attending: Emergency Medicine | Admitting: Emergency Medicine

## 2016-09-21 DIAGNOSIS — Y939 Activity, unspecified: Secondary | ICD-10-CM | POA: Diagnosis not present

## 2016-09-21 DIAGNOSIS — J45909 Unspecified asthma, uncomplicated: Secondary | ICD-10-CM | POA: Insufficient documentation

## 2016-09-21 DIAGNOSIS — Y999 Unspecified external cause status: Secondary | ICD-10-CM | POA: Insufficient documentation

## 2016-09-21 DIAGNOSIS — S39012A Strain of muscle, fascia and tendon of lower back, initial encounter: Secondary | ICD-10-CM | POA: Insufficient documentation

## 2016-09-21 DIAGNOSIS — Z7984 Long term (current) use of oral hypoglycemic drugs: Secondary | ICD-10-CM | POA: Insufficient documentation

## 2016-09-21 DIAGNOSIS — F909 Attention-deficit hyperactivity disorder, unspecified type: Secondary | ICD-10-CM | POA: Insufficient documentation

## 2016-09-21 DIAGNOSIS — I10 Essential (primary) hypertension: Secondary | ICD-10-CM | POA: Insufficient documentation

## 2016-09-21 DIAGNOSIS — F4322 Adjustment disorder with anxiety: Secondary | ICD-10-CM | POA: Diagnosis not present

## 2016-09-21 DIAGNOSIS — Y92410 Unspecified street and highway as the place of occurrence of the external cause: Secondary | ICD-10-CM | POA: Insufficient documentation

## 2016-09-21 DIAGNOSIS — E119 Type 2 diabetes mellitus without complications: Secondary | ICD-10-CM | POA: Diagnosis not present

## 2016-09-21 DIAGNOSIS — Z79899 Other long term (current) drug therapy: Secondary | ICD-10-CM | POA: Insufficient documentation

## 2016-09-21 DIAGNOSIS — S3992XA Unspecified injury of lower back, initial encounter: Secondary | ICD-10-CM | POA: Diagnosis present

## 2016-09-21 MED ORDER — IBUPROFEN 800 MG PO TABS: 800.0000 mg | ORAL_TABLET | Freq: Three times a day (TID) | ORAL | 0 refills | Status: DC

## 2016-09-21 MED ORDER — CYCLOBENZAPRINE HCL 10 MG PO TABS: 10.0000 mg | ORAL_TABLET | Freq: Three times a day (TID) | ORAL | 0 refills | Status: DC | PRN

## 2016-09-21 NOTE — Discharge Instructions (Signed)
Apply ice packs on/off to your back.  Follow-up with your doctor or return here for any worsening symptoms.
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
## 2016-09-23 ENCOUNTER — Ambulatory Visit (INDEPENDENT_AMBULATORY_CARE_PROVIDER_SITE_OTHER): Payer: 59 | Admitting: Family Medicine

## 2016-09-23 ENCOUNTER — Encounter: Payer: Self-pay | Admitting: Family Medicine

## 2016-09-23 VITALS — BP 114/80 | Temp 98.3°F | Ht 64.0 in | Wt 341.2 lb

## 2016-09-23 DIAGNOSIS — M545 Low back pain, unspecified: Secondary | ICD-10-CM
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
## 2016-09-24 ENCOUNTER — Ambulatory Visit (HOSPITAL_COMMUNITY)
Admission: RE | Admit: 2016-09-24 | Discharge: 2016-09-24 | Disposition: A | Discharge status: Home / Self Care | Payer: 59 | Source: Ambulatory Visit | Attending: Family Medicine | Admitting: Family Medicine

## 2016-09-24 DIAGNOSIS — M545 Low back pain: Secondary | ICD-10-CM | POA: Diagnosis not present
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
## 2016-10-06 ENCOUNTER — Ambulatory Visit (INDEPENDENT_AMBULATORY_CARE_PROVIDER_SITE_OTHER): Payer: 59 | Admitting: Psychiatry

## 2016-10-06 ENCOUNTER — Encounter (HOSPITAL_COMMUNITY): Payer: Self-pay | Admitting: Psychiatry

## 2016-10-06 DIAGNOSIS — F4322 Adjustment disorder with anxiety: Secondary | ICD-10-CM

## 2016-10-06 NOTE — Progress Notes (Signed)
ab 
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
## 2016-10-20 ENCOUNTER — Encounter (HOSPITAL_COMMUNITY): Payer: Self-pay | Admitting: Psychiatry

## 2016-10-20 ENCOUNTER — Ambulatory Visit (INDEPENDENT_AMBULATORY_CARE_PROVIDER_SITE_OTHER): Payer: 59 | Admitting: Psychiatry

## 2016-10-20 DIAGNOSIS — F4322 Adjustment disorder with anxiety: Secondary | ICD-10-CM | POA: Diagnosis not present
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
## 2016-10-23 ENCOUNTER — Other Ambulatory Visit: Payer: Self-pay | Admitting: Advanced Practice Midwife

## 2016-10-26 ENCOUNTER — Other Ambulatory Visit: Payer: Self-pay | Admitting: *Deleted

## 2016-10-27 MED ORDER — NORGESTIMATE-ETH ESTRADIOL 0.25-35 MG-MCG PO TABS: 1.0000 | ORAL_TABLET | Freq: Every day | ORAL | 11 refills | Status: DC

## 2016-11-03 ENCOUNTER — Ambulatory Visit (HOSPITAL_COMMUNITY): Payer: Self-pay | Admitting: Psychiatry

## 2016-11-03 ENCOUNTER — Telehealth (HOSPITAL_COMMUNITY): Payer: Self-pay | Admitting: *Deleted

## 2016-11-03 NOTE — Telephone Encounter (Signed)
lmtcb on numbers on file to resch appt for 11-03-16 due to weather.

## 2016-11-12 ENCOUNTER — Ambulatory Visit: Payer: 59 | Admitting: Nurse Practitioner

## 2016-11-15 ENCOUNTER — Encounter: Payer: Self-pay | Admitting: Family Medicine

## 2016-11-17 ENCOUNTER — Ambulatory Visit (HOSPITAL_COMMUNITY): Payer: Self-pay | Admitting: Psychiatry

## 2017-03-28 ENCOUNTER — Ambulatory Visit (INDEPENDENT_AMBULATORY_CARE_PROVIDER_SITE_OTHER): Payer: 59 | Admitting: Family Medicine

## 2017-03-28 ENCOUNTER — Encounter: Payer: Self-pay | Admitting: Family Medicine

## 2017-03-28 VITALS — BP 138/88 | Temp 98.3°F | Ht 64.0 in | Wt 341.4 lb

## 2017-03-28 DIAGNOSIS — K529 Noninfective gastroenteritis and colitis, unspecified: Secondary | ICD-10-CM

## 2017-03-28 MED ORDER — ONDANSETRON HCL 8 MG PO TABS: 8.0000 mg | ORAL_TABLET | Freq: Three times a day (TID) | ORAL | 1 refills | Status: DC | PRN

## 2017-03-28 NOTE — Progress Notes (Signed)
   Subjective:    Patient ID: Michelle Tate, female    DOB: 1996-07-26, 21 y.o.   MRN: [id number]  Diarrhea   This is a new problem. The current episode started yesterday. Associated symptoms include vomiting. Pertinent negatives include no coughing or fever. Associated symptoms comments: Fatigue . She has tried nothing for the symptoms.  Patient and nausea vomiting diarrhea symptoms present since eating out last evening Patient states no other concerns this visit.   Review of Systems  Constitutional: Negative for fever.  HENT: Negative for congestion.   Respiratory: Negative for cough.   Cardiovascular: Negative for chest pain.  Gastrointestinal: Positive for diarrhea, nausea and vomiting.   Denies bloody stool denies fever    Objective:   Physical Exam  Constitutional: She appears well-nourished. No distress.  Cardiovascular: Normal rate, regular rhythm and normal heart sounds.   No murmur heard. Pulmonary/Chest: Effort normal and breath sounds normal. No respiratory distress.  Musculoskeletal: She exhibits no edema.  Lymphadenopathy:    She has no cervical adenopathy.  Neurological: She is alert. She exhibits normal muscle tone.  Psychiatric: Her behavior is normal.  Vitals reviewed.     Patient does have a little bit of abdominal tenderness but no guarding or rebound she is not dehydrated    Assessment & Plan:  Viral gastroenteritis versus possibility of food issues/poisoning  Supportive measures Imodium when necessary clear liquids bland diet hold off on metformin until doing better Zofran for nausea no work next couple days

## 2017-04-07 ENCOUNTER — Ambulatory Visit (INDEPENDENT_AMBULATORY_CARE_PROVIDER_SITE_OTHER): Payer: 59 | Admitting: Women's Health

## 2017-04-07 ENCOUNTER — Encounter: Payer: Self-pay | Admitting: Women's Health

## 2017-04-07 VITALS — BP 138/94 | HR 98 | Ht 66.0 in | Wt 341.0 lb

## 2017-04-07 DIAGNOSIS — Z3009 Encounter for other general counseling and advice on contraception: Secondary | ICD-10-CM | POA: Diagnosis not present

## 2017-04-07 DIAGNOSIS — N898 Other specified noninflammatory disorders of vagina: Secondary | ICD-10-CM

## 2017-04-07 DIAGNOSIS — N920 Excessive and frequent menstruation with regular cycle: Secondary | ICD-10-CM

## 2017-04-07 LAB — POCT WET PREP (WET MOUNT)
Clue Cells Wet Prep Whiff POC: NEGATIVE
TRICHOMONAS WET PREP HPF POC: ABSENT

## 2017-04-07 NOTE — Patient Instructions (Signed)
Call when your period starts to have iud put in while on your period  Levonorgestrel intrauterine device (IUD) What is this medicine? LEVONORGESTREL IUD (LEE voe nor jes trel) is a contraceptive (birth control) device. The device is placed inside the uterus by a healthcare professional. It is used to prevent pregnancy. This device can also be used to treat heavy bleeding that occurs during your period. This medicine may be used for other purposes; ask your health care provider or pharmacist if you have questions. COMMON BRAND NAME(S): Cameron Ali What should I tell my health care provider before I take this medicine? They need to know if you have any of these conditions: -abnormal Pap smear -cancer of the breast, uterus, or cervix -diabetes -endometritis -genital or pelvic infection now or in the past -have more than one sexual partner or your partner has more than one partner -heart disease -history of an ectopic or tubal pregnancy -immune system problems -IUD in place -liver disease or tumor -problems with blood clots or take blood-thinners -seizures -use intravenous drugs -uterus of unusual shape -vaginal bleeding that has not been explained -an unusual or allergic reaction to levonorgestrel, other hormones, silicone, or polyethylene, medicines, foods, dyes, or preservatives -pregnant or trying to get pregnant -breast-feeding How should I use this medicine? This device is placed inside the uterus by a health care professional. Talk to your pediatrician regarding the use of this medicine in children. Special care may be needed. Overdosage: If you think you have taken too much of this medicine contact a poison control center or emergency room at once. NOTE: This medicine is only for you. Do not share this medicine with others. What if I miss a dose? This does not apply. Depending on the brand of device you have inserted, the device will need to be replaced every  3 to 5 years if you wish to continue using this type of birth control. What may interact with this medicine? Do not take this medicine with any of the following medications: -amprenavir -bosentan -fosamprenavir This medicine may also interact with the following medications: -aprepitant -armodafinil -barbiturate medicines for inducing sleep or treating seizures -bexarotene -boceprevir -griseofulvin -medicines to treat seizures like carbamazepine, ethotoin, felbamate, oxcarbazepine, phenytoin, topiramate -modafinil -pioglitazone -rifabutin -rifampin -rifapentine -some medicines to treat HIV infection like atazanavir, efavirenz, indinavir, lopinavir, nelfinavir, tipranavir, ritonavir -St. John's wort -warfarin This list may not describe all possible interactions. Give your health care provider a list of all the medicines, herbs, non-prescription drugs, or dietary supplements you use. Also tell them if you smoke, drink alcohol, or use illegal drugs. Some items may interact with your medicine. What should I watch for while using this medicine? Visit your doctor or health care professional for regular check ups. See your doctor if you or your partner has sexual contact with others, becomes HIV positive, or gets a sexual transmitted disease. This product does not protect you against HIV infection (AIDS) or other sexually transmitted diseases. You can check the placement of the IUD yourself by reaching up to the top of your vagina with clean fingers to feel the threads. Do not pull on the threads. It is a good habit to check placement after each menstrual period. Call your doctor right away if you feel more of the IUD than just the threads or if you cannot feel the threads at all. The IUD may come out by itself. You may become pregnant if the device comes out. If you notice that the  IUD has come out use a backup birth control method like condoms and call your health care provider. Using tampons  will not change the position of the IUD and are okay to use during your period. This IUD can be safely scanned with magnetic resonance imaging (MRI) only under specific conditions. Before you have an MRI, tell your healthcare provider that you have an IUD in place, and which type of IUD you have in place. What side effects may I notice from receiving this medicine? Side effects that you should report to your doctor or health care professional as soon as possible: -allergic reactions like skin rash, itching or hives, swelling of the face, lips, or tongue -fever, flu-like symptoms -genital sores -high blood pressure -no menstrual period for 6 weeks during use -pain, swelling, warmth in the leg -pelvic pain or tenderness -severe or sudden headache -signs of pregnancy -stomach cramping -sudden shortness of breath -trouble with balance, talking, or walking -unusual vaginal bleeding, discharge -yellowing of the eyes or skin Side effects that usually do not require medical attention (report to your doctor or health care professional if they continue or are bothersome): -acne -breast pain -change in sex drive or performance -changes in weight -cramping, dizziness, or faintness while the device is being inserted -headache -irregular menstrual bleeding within first 3 to 6 months of use -nausea This list may not describe all possible side effects. Call your doctor for medical advice about side effects. You may report side effects to FDA at 1-800-FDA-1088. Where should I keep my medicine? This does not apply. NOTE: This sheet is a summary. It may not cover all possible information. If you have questions about this medicine, talk to your doctor, pharmacist, or health care provider.  2018 Elsevier/Gold Standard (2016-07-16 14:14:56)

## 2017-04-07 NOTE — Progress Notes (Signed)
   [facility] Visit  Patient name: Michelle Peoplelecia N Rolston MRN [id number]  Date of birth: 1995-11-18  CC & HPI:  Michelle Tate is a 21 y.o. G0P0000 African American female presenting today for report of vaginal d/c w/ itching x 1wk. Used douche this am and feels better. Denies odor. Usually has itching prior to and right after period. Stopped coc's ~783mths ago, couldn't remember to take. Has been using condoms since. Periods have been heavy since stopping pills, last 7d, changes saturdated pad q 2hrs +cramps. Interested in another form of contraception. Discussed all. Wants IUD.  Denies h/o HTN. Sees pcp q 3mths. All bp's have been normal. Does have strong family h/o HTN.  Patient's last menstrual period was 03/18/2017. The current method of family planning is condoms always. Last pap 04/15/15, neg  Pertinent History Reviewed:  Medical & Surgical Hx:   Past medical, surgical, family, and social history reviewed in electronic medical record Medications: Reviewed & Updated - see associated section Allergies: Reviewed in electronic medical record  Objective Findings:  Vitals: BP (!) 138/94 (BP Location: Left Arm, Patient Position: Sitting, Cuff Size: Large)   Pulse 98   Ht 5\' 6"  (1.676 m)   Wt (!) 341 lb (154.7 kg)   LMP 03/18/2017   BMI 55.04 kg/m  Body mass index is 55.04 kg/m.  BP recheck 130/88  Physical Examination: General appearance - alert, well appearing, and in no distress Pelvic - normal external genitalia, cx clear, small amt yellow non-odorous d/c, wet prep and gc/ct collected  Results for orders placed or performed in visit on 04/07/17 (from the past 24 hour(s))  POCT Wet Prep Mellody Drown(Wet [place])   Collection Time: 04/07/17  2:54 PM  Result Value Ref Range   Source Wet Prep POC vaginal    WBC, Wet Prep HPF POC mod    Bacteria Wet Prep HPF POC None (A) Few   BACTERIA WET PREP MORPHOLOGY POC     Clue Cells Wet Prep HPF POC None None   Clue Cells Wet Prep Whiff POC Negative  Whiff    Yeast Wet Prep HPF POC None    KOH Wet Prep POC     Trichomonas Wet Prep HPF POC Absent Absent     Assessment & Plan:  A:   Vaginal d/c  Contraception counseling  Elevated bp x 1  P:  Send gc/ct today, if neg will rx mytrex  Do not douche  Condoms always  F/U w/ pcp as scheduled- keep eye on bp  Discussed different IUDs, prefers Mirena d/t her heavy periods/hopes of stopping/lightening periods  Return for pt will call when period starts for iud insertion.  Marge DuncansBooker, Sloan Takagi Randall CNM, [facility]NP-BC 04/07/2017 2:54 PM

## 2017-04-09 LAB — GC/CHLAMYDIA PROBE AMP
Chlamydia trachomatis, NAA: NEGATIVE
NEISSERIA GONORRHOEAE BY PCR: NEGATIVE

## 2017-04-11 ENCOUNTER — Other Ambulatory Visit: Payer: Self-pay | Admitting: Women's Health

## 2017-04-11 MED ORDER — NYSTATIN-TRIAMCINOLONE 100000-0.1 UNIT/GM-% EX CREA: 1.0000 "application " | TOPICAL_CREAM | Freq: Two times a day (BID) | CUTANEOUS | 0 refills | Status: DC

## 2017-04-11 NOTE — Progress Notes (Signed)
Called pt, notified her gc/ct neg, rx mycolog for itching, let us know if doesn't help.  Cheral MarkerKimberly R. Booker, CNM, WHNP-BC 04/11/2017 1:25 PM

## 2017-04-14 ENCOUNTER — Telehealth: Payer: Self-pay | Admitting: Women's Health

## 2017-04-15 NOTE — Telephone Encounter (Signed)
Attempted to call pt to let her know in order to discuss birth control she would need to make an appointment with a provider. No answer x 2 and voicemail full.

## 2017-05-18 ENCOUNTER — Encounter: Payer: Self-pay | Admitting: Family Medicine

## 2017-05-18 ENCOUNTER — Ambulatory Visit (INDEPENDENT_AMBULATORY_CARE_PROVIDER_SITE_OTHER): Payer: 59 | Admitting: Family Medicine

## 2017-05-18 VITALS — Temp 98.2°F | Ht 64.0 in | Wt 337.0 lb

## 2017-05-18 DIAGNOSIS — Z79899 Other long term (current) drug therapy: Secondary | ICD-10-CM | POA: Diagnosis not present

## 2017-05-18 DIAGNOSIS — Z13220 Encounter for screening for lipoid disorders: Secondary | ICD-10-CM | POA: Diagnosis not present

## 2017-05-18 DIAGNOSIS — L84 Corns and callosities: Secondary | ICD-10-CM

## 2017-05-18 DIAGNOSIS — E119 Type 2 diabetes mellitus without complications: Secondary | ICD-10-CM

## 2017-05-18 NOTE — Progress Notes (Signed)
   Subjective:    Patient ID: Michelle Tate, female    DOB: [birth year], 23 y.o.   MRN: [id number]  HPIknot on both great toes that is painful. Not sure when it came up. Tried switching shoes.  Patient relates that she's noted some areas on her toes she wonders if she is developing bunions she does not have any sign of infection she denies chest tightness pressure pain shortness breath She is trying to lose weight She is try to watch her diet doing some walking in addition to this she is taking her medications   Review of Systems See above    Objective:   Physical Exam Calluses are noted on both great toes no sign of ulcers no sign of infection Lungs clear heart regular pulse normal       Assessment & Plan:  Diabetes-unknown regarding overall control does need to do lab work has not followed up recently  Calluses on both toes I talked about safe ways to try to keep those down also talked with her that if they get worse to go to podiatry no sign of any ulcers

## 2017-10-21 ENCOUNTER — Telehealth: Payer: Self-pay

## 2017-10-21 ENCOUNTER — Encounter (HOSPITAL_COMMUNITY): Payer: Self-pay | Admitting: Emergency Medicine

## 2017-10-21 ENCOUNTER — Emergency Department (HOSPITAL_COMMUNITY): Payer: Self-pay

## 2017-10-21 ENCOUNTER — Emergency Department (HOSPITAL_COMMUNITY)
Admission: EM | Admit: 2017-10-21 | Discharge: 2017-10-21 | Disposition: A | Discharge status: Home / Self Care | Payer: Self-pay | Attending: Emergency Medicine | Admitting: Emergency Medicine

## 2017-10-21 ENCOUNTER — Other Ambulatory Visit: Payer: Self-pay

## 2017-10-21 DIAGNOSIS — I10 Essential (primary) hypertension: Secondary | ICD-10-CM | POA: Insufficient documentation

## 2017-10-21 DIAGNOSIS — J45909 Unspecified asthma, uncomplicated: Secondary | ICD-10-CM | POA: Insufficient documentation

## 2017-10-21 DIAGNOSIS — Z91010 Allergy to peanuts: Secondary | ICD-10-CM | POA: Insufficient documentation

## 2017-10-21 DIAGNOSIS — E119 Type 2 diabetes mellitus without complications: Secondary | ICD-10-CM | POA: Insufficient documentation

## 2017-10-21 DIAGNOSIS — Z7984 Long term (current) use of oral hypoglycemic drugs: Secondary | ICD-10-CM | POA: Insufficient documentation

## 2017-10-21 DIAGNOSIS — R52 Pain, unspecified: Secondary | ICD-10-CM

## 2017-10-21 DIAGNOSIS — F17210 Nicotine dependence, cigarettes, uncomplicated: Secondary | ICD-10-CM | POA: Insufficient documentation

## 2017-10-21 DIAGNOSIS — K8050 Calculus of bile duct without cholangitis or cholecystitis without obstruction: Secondary | ICD-10-CM | POA: Insufficient documentation

## 2017-10-21 LAB — URINALYSIS, ROUTINE W REFLEX MICROSCOPIC
BILIRUBIN URINE: NEGATIVE
Glucose, UA: NEGATIVE mg/dL
HGB URINE DIPSTICK: NEGATIVE
KETONES UR: NEGATIVE mg/dL
Leukocytes, UA: NEGATIVE
NITRITE: NEGATIVE
Protein, ur: NEGATIVE mg/dL
Specific Gravity, Urine: 1.016 (ref 1.005–1.030)
pH: 6 (ref 5.0–8.0)

## 2017-10-21 LAB — COMPREHENSIVE METABOLIC PANEL WITH GFR
ALT: 10 U/L — ABNORMAL LOW (ref 14–54)
AST: 14 U/L — ABNORMAL LOW (ref 15–41)
Albumin: 3.7 g/dL (ref 3.5–5.0)
Alkaline Phosphatase: 94 U/L (ref 38–126)
Anion gap: 11 (ref 5–15)
BUN: 8 mg/dL (ref 6–20)
CO2: 25 mmol/L (ref 22–32)
Calcium: 9 mg/dL (ref 8.9–10.3)
Chloride: 102 mmol/L (ref 101–111)
Creatinine, Ser: 0.67 mg/dL (ref 0.44–1.00)
GFR calc Af Amer: >60 mL/min
GFR calc non Af Amer: >60 mL/min
Glucose, Bld: 117 mg/dL — ABNORMAL HIGH (ref 65–99)
Potassium: 4.4 mmol/L (ref 3.5–5.1)
Sodium: 138 mmol/L (ref 135–145)
Total Bilirubin: 0.4 mg/dL (ref 0.3–1.2)
Total Protein: 7.7 g/dL (ref 6.5–8.1)

## 2017-10-21 LAB — CBC
HCT: 38.6 % (ref 36.0–46.0)
Hemoglobin: 11.7 g/dL — ABNORMAL LOW (ref 12.0–15.0)
MCH: 23.2 pg — AB (ref 26.0–34.0)
MCHC: 30.3 g/dL (ref 30.0–36.0)
MCV: 76.4 fL — ABNORMAL LOW (ref 78.0–100.0)
PLATELETS: 371 10*3/uL (ref 150–400)
RBC: 5.05 MIL/uL (ref 3.87–5.11)
RDW: 16 % — AB (ref 11.5–15.5)
WBC: 8.4 10*3/uL (ref 4.0–10.5)

## 2017-10-21 LAB — LIPASE, BLOOD: Lipase: 59 U/L — ABNORMAL HIGH (ref 11–51)

## 2017-10-21 LAB — HCG, QUANTITATIVE, PREGNANCY

## 2017-10-21 LAB — CBG MONITORING, ED: Glucose-Capillary: 92 mg/dL (ref 65–99)

## 2017-10-21 MED ORDER — ONDANSETRON 4 MG PO TBDP
4.0000 mg | ORAL_TABLET | Freq: Once | ORAL | Status: AC
  Administered 2017-10-21: 4 mg via ORAL
  Filled 2017-10-21: qty 1

## 2017-10-21 MED ORDER — MORPHINE SULFATE (PF) 4 MG/ML IV SOLN
4.0000 mg | Freq: Once | INTRAVENOUS | Status: AC
  Administered 2017-10-21: 4 mg via INTRAVENOUS
  Filled 2017-10-21: qty 1

## 2017-10-21 MED ORDER — ONDANSETRON HCL 4 MG PO TABS: 4.0000 mg | ORAL_TABLET | Freq: Four times a day (QID) | ORAL | 0 refills | Status: DC | PRN

## 2017-10-21 MED ORDER — HYDROCODONE-ACETAMINOPHEN 5-325 MG PO TABS: 1.0000 | ORAL_TABLET | ORAL | 0 refills | Status: DC | PRN

## 2017-10-21 MED ORDER — GI COCKTAIL ~~LOC~~
30.0000 mL | Freq: Once | ORAL | Status: AC
  Administered 2017-10-21: 30 mL via ORAL
  Filled 2017-10-21: qty 30

## 2017-10-21 NOTE — Telephone Encounter (Signed)
Pt called with complaints of fever/chills,headache,vomitting,diarrhea,mentral cramps.I checked with Dr.Scott he advised to send the pt to urgent care as we have no availability for appts. She states understanding and will seek treatment at an urgent care.

## 2017-10-21 NOTE — Consult Note (Signed)
[facility] Consult  Reason for Consult: Cholelithiasis  Referring Physician: Dr. Lita Mains  Chief Complaint    Abdominal Pain      Michelle Tate is a 22 y.o. female.  HPI: Michelle Tate is a 22 yo who presented to the Ed with RUQ pain that has since resolved. She underwent an [place] that demonstrated stones, no thickening, and a CBD that was 8 mm with no obvious defect, and LFTs that had normal T bili.  She reports she has similar pain with her menstruation, but on asking about pain with greasy food, she does report that she has had this in the past.  She also thinks she eats more greasy food during menstruation. Currently she feels better, and has no pain or nausea. She has no insurance and would Iike to go home to discuss removing her gallbladder electively.   Past Medical History:  Diagnosis Date  . ADHD (attention deficit hyperactivity disorder)   . Allergy   . Asthma   . Bronchitis   . Chronic headache   . Diabetes mellitus without complication ([place])   . Hyperinsulinemia   . Hypertension   . Irregular bleeding 02/27/2013  . Morbid obesity ([place])   . Obesity 02/27/2013    Past Surgical History:  Procedure Laterality Date  . implanon placement    . TONSILLECTOMY    . WISDOM TOOTH EXTRACTION      Family History  Problem Relation Age of Onset  . Diabetes Paternal Grandmother   . Hypertension Paternal Grandmother   . Hypertension Maternal Grandmother   . Heart disease Father   . Hypertension Father   . Diabetes Father   . Seizures Mother   . Stroke Other   . Cancer Other   . Thyroid disease Cousin     Social History   Tobacco Use  . Smoking status: Current Some Day Smoker    Years: 1.00    Types: Cigarettes  . Smokeless tobacco: Never Used  Substance Use Topics  . Alcohol use: Yes    Alcohol/week: 0.0 oz    Comment: occ  . Drug use: No    Medications: I have reviewed the patient's current medications. No current facility-administered  medications for this encounter.    Current Outpatient Medications  Medication Sig Dispense Refill Last Dose  . acetaminophen (TYLENOL) 325 MG tablet Take 325 mg by mouth every 6 (six) hours as needed.   10/20/2017 at Unknown time  . albuterol (PROVENTIL HFA;VENTOLIN HFA) 108 (90 Base) MCG/ACT inhaler Inhale 2 puffs into the lungs every 4 (four) hours as needed for wheezing or shortness of breath. 1 Inhaler 2 unknown  . ibuprofen (ADVIL,MOTRIN) 800 MG tablet Take 1 tablet (800 mg total) by mouth 3 (three) times daily. (Patient taking differently: Take 800 mg by mouth as needed for headache or cramping. ) 21 tablet 0 unknown  . metFORMIN (GLUCOPHAGE XR) 500 MG 24 hr tablet Take 1 tablet (500 mg total) by mouth daily after supper. 30 tablet 2 10/20/2017 at Unknown time  . HYDROcodone-acetaminophen (NORCO) 5-325 MG tablet Take 1 tablet by mouth every 4 (four) hours as needed for severe pain. 10 tablet 0   . ondansetron (ZOFRAN) 4 MG tablet Take 1 tablet (4 mg total) by mouth every 6 (six) hours as needed for nausea or vomiting. 12 tablet 0     Allergies: Allergies  Allergen Reactions  . Banana Anaphylaxis and Swelling  . Peanut-Containing Drug Products Anaphylaxis and Swelling  . Penicillins Anaphylaxis, Itching  and Swelling    Has patient had a PCN reaction causing immediate rash, facial/tongue/throat swelling, SOB or lightheadedness with hypotension: No Has patient had a PCN reaction causing severe rash involving mucus membranes orNo skin necrosis: No Has patient had a PCN reaction that required hospitalization: No Has patient had a PCN reaction occurring within the last 10 years: No If all of the above answers are "NO", then may proceed with Cephalosporin use.   . Sulfa Drugs Cross Reactors Anaphylaxis, Itching and Swelling  . Vanilla Other (See Comments)    Confirmed on Allergy Test     ROS:  A comprehensive review of systems was negative except for: Gastrointestinal: positive for right  sided abdominal pain  Blood pressure 118/72, pulse 88, temperature 98.1 F (36.7 C), temperature source Oral, resp. rate 18, height 5' 5" (1.651 m), weight (!) 330 lb (149.7 kg), last menstrual period 10/15/2017, SpO2 100 %. Physical Exam  Results: Results for orders placed or performed during the hospital encounter of 10/21/17 (from the past 48 hour(s))  Urinalysis, Routine w reflex microscopic     Status: None   Collection Time: 10/21/17 10:33 AM  Result Value Ref Range   Color, Urine YELLOW YELLOW   APPearance CLEAR CLEAR   Specific Gravity, Urine 1.016 1.005 - 1.030   pH 6.0 5.0 - 8.0   Glucose, UA NEGATIVE NEGATIVE mg/dL   Hgb urine dipstick NEGATIVE NEGATIVE   Bilirubin Urine NEGATIVE NEGATIVE   Ketones, ur NEGATIVE NEGATIVE mg/dL   Protein, ur NEGATIVE NEGATIVE mg/dL   Nitrite NEGATIVE NEGATIVE   Leukocytes, UA NEGATIVE NEGATIVE  Lipase, blood     Status: Abnormal   Collection Time: 10/21/17 11:38 AM  Result Value Ref Range   Lipase 59 (H) 11 - 51 U/L  Comprehensive metabolic panel     Status: Abnormal   Collection Time: 10/21/17 11:38 AM  Result Value Ref Range   Sodium 138 135 - 145 mmol/L   Potassium 4.4 3.5 - 5.1 mmol/L   Chloride 102 101 - 111 mmol/L   CO2 25 22 - 32 mmol/L   Glucose, Bld 117 (H) 65 - 99 mg/dL   BUN 8 6 - 20 mg/dL   Creatinine, Ser 0.67 0.44 - 1.00 mg/dL   Calcium 9.0 8.9 - 10.3 mg/dL   Total Protein 7.7 6.5 - 8.1 g/dL   Albumin 3.7 3.5 - 5.0 g/dL   AST 14 (L) 15 - 41 U/L   ALT 10 (L) 14 - 54 U/L   Alkaline Phosphatase 94 38 - 126 U/L   Total Bilirubin 0.4 0.3 - 1.2 mg/dL   GFR calc non Af Amer >60 >60 mL/min   GFR calc Af Amer >60 >60 mL/min    Comment: (NOTE) The eGFR has been calculated using the CKD EPI equation. This calculation has not been validated in all clinical situations. eGFR's persistently <60 mL/min signify possible Chronic Kidney Disease.    Anion gap 11 5 - 15  CBC     Status: Abnormal   Collection Time: 10/21/17 11:38  AM  Result Value Ref Range   WBC 8.4 4.0 - 10.5 K/uL   RBC 5.05 3.87 - 5.11 MIL/uL   Hemoglobin 11.7 (L) 12.0 - 15.0 g/dL   HCT 38.6 36.0 - 46.0 %   MCV 76.4 (L) 78.0 - 100.0 fL   MCH 23.2 (L) 26.0 - 34.0 pg   MCHC 30.3 30.0 - 36.0 g/dL   RDW 16.0 (H) 11.5 - 15.5 %   Platelets  371 150 - 400 K/uL  hCG, quantitative, pregnancy     Status: None   Collection Time: 10/21/17 11:38 AM  Result Value Ref Range   hCG, Beta Chain, Quant, S <1 <5 mIU/mL    Comment:          GEST. AGE      CONC.  (mIU/mL)   <=1 WEEK        5 - 50     2 WEEKS       50 - 500     3 WEEKS       100 - 10,000     4 WEEKS     1,000 - 30,000     5 WEEKS     3,500 - 115,000   6-8 WEEKS     12,000 - 270,000    12 WEEKS     15,000 - 220,000        FEMALE AND NON-PREGNANT FEMALE:     LESS THAN 5 mIU/mL   POC CBG, ED     Status: None   Collection Time: 10/21/17  4:20 PM  Result Value Ref Range   Glucose-Capillary 92 65 - 99 mg/dL   Personally reviewed imagining, stones, no thickening  US Abdomen Limited  Result Date: 10/21/2017 CLINICAL DATA:  Upper abdominal pain EXAM: ULTRASOUND ABDOMEN LIMITED RIGHT UPPER QUADRANT COMPARISON:  None. FINDINGS: Gallbladder: Multiple layering gallstones. No wall thickening or focal tenderness. Common bile duct: Limited visualization. Maximal diameter of 8 mm where seen. No convincing filling defect. Liver: Diffusely echogenic liver with poor acoustic penetration. No evidence of mass lesion. Portal vein is patent on color Doppler imaging with normal direction of blood flow towards the liver. IMPRESSION: 1. Cholelithiasis. No wall thickening or fluid to suggest acute cholecystitis. The gallbladder is focally tender and could be obstructed. 2. Upper normal common bile duct diameter of 8 mm. 3. Hepatic steatosis. Electronically Signed   By: Monte Fantasia M.D.   On: 10/21/2017 13:28     Assessment & Plan:  Michelle Tate is a 22 y.o. female with symptomatic cholelithiasis that has now  improved. She also has a dilated CBD but no stones in the duct and normal T bili. She is at risk for having choledocholithiasis but given the normal labs and no further pain it is unlikely. She would like to go home and follow up electively.  We have discussed extensively the possibility of getting a stone in the CBD and what to look out for including worsening pain, fevers, jaundice symptoms.  She understands and will return to the ED with any worsening symptoms. Otherwise she can follow up in the office and will plan for laparoscopic cholecystectomy and IOC.   All questions were answered to the satisfaction of the patient and family (mom was on the speaker phone during discussion).     Virl Cagey 10/21/2017, 4:54 PM

## 2017-10-21 NOTE — ED Triage Notes (Signed)
pt reports abd pain since last night, chills. Pt reports emesis and diarrhea this am. Pt denies any known fevers or recent abx use.

## 2017-10-21 NOTE — ED Notes (Signed)
Patient transported to Ultrasound 

## 2017-10-21 NOTE — ED Provider Notes (Signed)
[facility] EMERGENCY DEPARTMENT Provider Note   CSN: [id number] Arrival date & time: 10/21/17  1009     History   Chief Complaint Chief Complaint  Patient presents with  . Abdominal Pain    HPI Michelle Tate is a 22 y.o. female.  HPI Patient presents with abdominal pain with several episodes of vomiting starting yesterday evening.  Patient also states she had some loose stool.  No blood in the vomit or stool.  No fever.  Denies any urinary symptoms including dysuria, frequency or urgency.  Patient states she is on her period. Past Medical History:  Diagnosis Date  . ADHD (attention deficit hyperactivity disorder)   . Allergy   . Asthma   . Bronchitis   . Chronic headache   . Diabetes mellitus without complication (HCC)   . Hyperinsulinemia   . Hypertension   . Irregular bleeding 02/27/2013  . Morbid obesity (HCC)   . Obesity 02/27/2013    Patient Active Problem List   Diagnosis Date Noted  . Adjustment disorder with anxious mood 09/14/2016  . Adjustment disorder with mixed anxiety and depressed mood 08/17/2016  . Vitamin D deficiency 03/26/2016  . Vulvar irritation 03/31/2015  . DUB (dysfunctional uterine bleeding) 02/22/2014  . Irregular bleeding 02/27/2013  . Morbid obesity (HCC) 02/27/2013  . Asthma 02/26/2013  . 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    Past Surgical History:  Procedure Laterality Date  . implanon placement    . TONSILLECTOMY    . WISDOM TOOTH EXTRACTION      OB History    Gravida Para Term Preterm AB Living   0 0 0 0 0 0   SAB TAB Ectopic Multiple Live Births   0 0 0 0 0       Home Medications    Prior to Admission medications   Medication Sig Start Date End Date Taking? Authorizing Provider  acetaminophen (TYLENOL) 325 MG tablet Take 325 mg by mouth every 6 (six) hours as needed.   Yes [provider]  albuterol (PROVENTIL HFA;VENTOLIN HFA) 108 (90 Base) MCG/ACT inhaler Inhale 2 puffs into  the lungs every 4 (four) hours as needed for wheezing or shortness of breath. 12/17/15  Yes Campbell RichesHoskins, Carolyn C, NP  ibuprofen (ADVIL,MOTRIN) 800 MG tablet Take 1 tablet (800 mg total) by mouth 3 (three) times daily. Patient taking differently: Take 800 mg by mouth as needed for headache or cramping.  09/21/16  Yes Triplett, Tammy, PA-C  metFORMIN (GLUCOPHAGE XR) 500 MG 24 hr tablet Take 1 tablet (500 mg total) by mouth daily after supper. 03/26/16  Yes Nida, Denman GeorgeGebreselassie W, MD  HYDROcodone-acetaminophen (NORCO) 5-325 MG tablet Take 1 tablet by mouth every 4 (four) hours as needed for severe pain. 10/21/17   Loren RacerYelverton, Aybree Lanyon, MD  ondansetron (ZOFRAN) 4 MG tablet Take 1 tablet (4 mg total) by mouth every 6 (six) hours as needed for nausea or vomiting. 10/21/17   Loren RacerYelverton, Bowyn Mercier, MD    Family History Family History  Problem Relation Age of Onset  . Diabetes Paternal Grandmother   . Hypertension Paternal Grandmother   . Hypertension Maternal Grandmother   . Heart disease Father   . Hypertension Father   . Diabetes Father   . Seizures Mother   . Stroke Other   . Cancer Other   . Thyroid disease Cousin     Social History Social History   Tobacco Use  . Smoking status: Current Some Day Smoker    Years:  1.00    Types: Cigarettes  . Smokeless tobacco: Never Used  Substance Use Topics  . Alcohol use: Yes    Alcohol/week: 0.0 oz    Comment: occ  . Drug use: No     Allergies   Banana; Peanut-containing drug products; Penicillins; Sulfa drugs cross reactors; and Vanilla   Review of Systems Review of Systems  Constitutional: Positive for chills. Negative for fatigue and fever.  Eyes: Negative for visual disturbance.  Respiratory: Negative for cough and shortness of breath.   Cardiovascular: Negative for chest pain, palpitations and leg swelling.  Gastrointestinal: Positive for abdominal pain, diarrhea, nausea and vomiting. Negative for blood in stool and constipation.  Genitourinary:  Positive for vaginal bleeding. Negative for dysuria, flank pain, frequency, pelvic pain and vaginal discharge.  Musculoskeletal: Negative for back pain, myalgias, neck pain and neck stiffness.  Skin: Negative for rash and wound.  Neurological: Negative for dizziness, weakness, light-headedness, numbness and headaches.  All other systems reviewed and are negative.    Physical Exam Updated Vital Signs BP 127/70 (BP Location: Right Arm)   Pulse 98   Temp 98.6 F (37 C) (Oral)   Resp 18   Ht 5\' 5"  (1.651 m)   Wt (!) 149.7 kg (330 lb)   LMP 10/15/2017   SpO2 100%   BMI 54.91 kg/m   Physical Exam  Constitutional: She is oriented to person, place, and time. She appears well-developed and well-nourished. No distress.  HENT:  Head: Normocephalic and atraumatic.  Mouth/Throat: Oropharynx is clear and moist.  Eyes: EOM are normal. Pupils are equal, round, and reactive to light.  Neck: Normal range of motion. Neck supple.  Cardiovascular: Normal rate and regular rhythm.  Pulmonary/Chest: Effort normal and breath sounds normal.  Abdominal: Soft. Bowel sounds are normal. There is tenderness. There is no rebound and no guarding.  Patient has some tenderness to palpation in the right upper quadrant, epigastrium and left upper quadrants.  No rebound or guarding.  Musculoskeletal: Normal range of motion. She exhibits no edema or tenderness.  No CVA tenderness.  Neurological: She is alert and oriented to person, place, and time.  Skin: Skin is warm and dry. No rash noted. She is not diaphoretic. No erythema.  Psychiatric: She has a normal mood and affect. Her behavior is normal.  Nursing note and vitals reviewed.    ED Treatments / Results  Labs (all labs ordered are listed, but only abnormal results are displayed) Labs Reviewed  LIPASE, BLOOD - Abnormal; Notable for the following components:      Result Value   Lipase 59 (*)    All other components within normal limits  COMPREHENSIVE  METABOLIC PANEL - Abnormal; Notable for the following components:   Glucose, Bld 117 (*)    AST 14 (*)    ALT 10 (*)    All other components within normal limits  CBC - Abnormal; Notable for the following components:   Hemoglobin 11.7 (*)    MCV 76.4 (*)    MCH 23.2 (*)    RDW 16.0 (*)    All other components within normal limits  URINALYSIS, ROUTINE W REFLEX MICROSCOPIC  HCG, QUANTITATIVE, PREGNANCY  CBG MONITORING, ED    EKG  EKG Interpretation None       Radiology US Abdomen Limited  Result Date: 10/21/2017 CLINICAL DATA:  Upper abdominal pain EXAM: ULTRASOUND ABDOMEN LIMITED RIGHT UPPER QUADRANT COMPARISON:  None. FINDINGS: Gallbladder: Multiple layering gallstones. No wall thickening or focal tenderness. Common bile  duct: Limited visualization. Maximal diameter of 8 mm where seen. No convincing filling defect. Liver: Diffusely echogenic liver with poor acoustic penetration. No evidence of mass lesion. Portal vein is patent on color Doppler imaging with normal direction of blood flow towards the liver. IMPRESSION: 1. Cholelithiasis. No wall thickening or fluid to suggest acute cholecystitis. The gallbladder is focally tender and could be obstructed. 2. Upper normal common bile duct diameter of 8 mm. 3. Hepatic steatosis. Electronically Signed   By: Marnee Spring M.D.   On: 10/21/2017 13:28    Procedures Procedures (including critical care time)  Medications Ordered in ED Medications  ondansetron (ZOFRAN-ODT) disintegrating tablet 4 mg (4 mg Oral Given 10/21/17 1256)  gi cocktail (Maalox,Lidocaine,Donnatal) (30 mLs Oral Given 10/21/17 1258)  morphine 4 MG/ML injection 4 mg (4 mg Intravenous Given 10/21/17 1422)     Initial Impression / Assessment and Plan / ED Course  I have reviewed the triage vital signs and the nursing notes.  Pertinent labs & imaging results that were available during my care of the patient were reviewed by me and considered in my medical decision making  (see chart for details).     Ultrasound with evidence of cholelithiasis.  Common bile duct is at the upper limits of normal.  Given dose of IM morphine and patient states her pain is significantly improved.  A benign abdominal exam.  Discussed with Dr. Henreitta Leber.  Recommended MRCP.  Patient was unable to get an MRCP due to size.  Dr. Henreitta Leber saw patient in the emergency department and advised discharge home with outpatient follow-up.  Patient's been given strict return precautions and is voiced understanding.  Final Clinical Impressions(s) / ED Diagnoses   Final diagnoses:  Biliary colic    ED Discharge Orders        Ordered    ondansetron (ZOFRAN) 4 MG tablet  Every 6 hours PRN     10/21/17 1634    HYDROcodone-acetaminophen (NORCO) 5-325 MG tablet  Every 4 hours PRN     10/21/17 1634       Loren Racer, MD 10/21/17 1707

## 2017-10-21 NOTE — ED Notes (Signed)
Pt provided urine sample in triage. Unable to use sample. Pt reports is still on period, gross hematuria noted in sample. In and out cath will be needed for urine sample.

## 2017-10-22 DIAGNOSIS — K8050 Calculus of bile duct without cholangitis or cholecystitis without obstruction: Secondary | ICD-10-CM | POA: Insufficient documentation

## 2017-10-23 ENCOUNTER — Emergency Department (HOSPITAL_COMMUNITY)
Admission: EM | Admit: 2017-10-23 | Discharge: 2017-10-23 | Disposition: A | Discharge status: Home / Self Care | Payer: Self-pay | Attending: Emergency Medicine | Admitting: Emergency Medicine

## 2017-10-23 ENCOUNTER — Encounter (HOSPITAL_COMMUNITY): Payer: Self-pay | Admitting: Cardiology

## 2017-10-23 ENCOUNTER — Emergency Department (HOSPITAL_COMMUNITY): Payer: Self-pay

## 2017-10-23 DIAGNOSIS — Z79899 Other long term (current) drug therapy: Secondary | ICD-10-CM | POA: Insufficient documentation

## 2017-10-23 DIAGNOSIS — J45909 Unspecified asthma, uncomplicated: Secondary | ICD-10-CM | POA: Insufficient documentation

## 2017-10-23 DIAGNOSIS — Z8719 Personal history of other diseases of the digestive system: Secondary | ICD-10-CM | POA: Insufficient documentation

## 2017-10-23 DIAGNOSIS — F17210 Nicotine dependence, cigarettes, uncomplicated: Secondary | ICD-10-CM | POA: Insufficient documentation

## 2017-10-23 DIAGNOSIS — R1011 Right upper quadrant pain: Secondary | ICD-10-CM

## 2017-10-23 DIAGNOSIS — I10 Essential (primary) hypertension: Secondary | ICD-10-CM | POA: Insufficient documentation

## 2017-10-23 DIAGNOSIS — R109 Unspecified abdominal pain: Secondary | ICD-10-CM | POA: Insufficient documentation

## 2017-10-23 DIAGNOSIS — Z91010 Allergy to peanuts: Secondary | ICD-10-CM | POA: Insufficient documentation

## 2017-10-23 DIAGNOSIS — R1031 Right lower quadrant pain: Secondary | ICD-10-CM

## 2017-10-23 DIAGNOSIS — Z7984 Long term (current) use of oral hypoglycemic drugs: Secondary | ICD-10-CM | POA: Insufficient documentation

## 2017-10-23 DIAGNOSIS — E119 Type 2 diabetes mellitus without complications: Secondary | ICD-10-CM | POA: Insufficient documentation

## 2017-10-23 DIAGNOSIS — F909 Attention-deficit hyperactivity disorder, unspecified type: Secondary | ICD-10-CM | POA: Insufficient documentation

## 2017-10-23 LAB — COMPREHENSIVE METABOLIC PANEL
ALBUMIN: 3.6 g/dL (ref 3.5–5.0)
ALK PHOS: 88 U/L (ref 38–126)
ALT: 11 U/L — ABNORMAL LOW (ref 14–54)
AST: 17 U/L (ref 15–41)
Anion gap: 8 (ref 5–15)
BUN: 11 mg/dL (ref 6–20)
CO2: 26 mmol/L (ref 22–32)
Calcium: 8.9 mg/dL (ref 8.9–10.3)
Chloride: 104 mmol/L (ref 101–111)
Creatinine, Ser: 0.73 mg/dL (ref 0.44–1.00)
GFR calc Af Amer: >60 mL/min (ref >60)
GFR calc non Af Amer: >60 mL/min (ref >60)
GLUCOSE: 171 mg/dL — AB (ref 65–99)
Potassium: 3.9 mmol/L (ref 3.5–5.1)
SODIUM: 138 mmol/L (ref 135–145)
TOTAL PROTEIN: 7.5 g/dL (ref 6.5–8.1)

## 2017-10-23 LAB — CBC
HEMATOCRIT: 37 % (ref 36.0–46.0)
Hemoglobin: 11.3 g/dL — ABNORMAL LOW (ref 12.0–15.0)
MCH: 23.6 pg — AB (ref 26.0–34.0)
MCHC: 30.5 g/dL (ref 30.0–36.0)
MCV: 77.2 fL — ABNORMAL LOW (ref 78.0–100.0)
Platelets: 356 10*3/uL (ref 150–400)
RBC: 4.79 MIL/uL (ref 3.87–5.11)
RDW: 15.6 % — AB (ref 11.5–15.5)
WBC: 7 10*3/uL (ref 4.0–10.5)

## 2017-10-23 LAB — URINALYSIS, ROUTINE W REFLEX MICROSCOPIC
Bilirubin Urine: NEGATIVE
Glucose, UA: NEGATIVE mg/dL
HGB URINE DIPSTICK: NEGATIVE
Ketones, ur: NEGATIVE mg/dL
NITRITE: NEGATIVE
Protein, ur: NEGATIVE mg/dL
Specific Gravity, Urine: 1.024 (ref 1.005–1.030)
pH: 5 (ref 5.0–8.0)

## 2017-10-23 LAB — LIPASE, BLOOD: Lipase: 40 U/L (ref 11–51)

## 2017-10-23 MED ORDER — HYDROCODONE-ACETAMINOPHEN 5-325 MG PO TABS: 1.0000 | ORAL_TABLET | ORAL | 0 refills | Status: DC | PRN

## 2017-10-23 MED ORDER — IBUPROFEN 800 MG PO TABS: 800.0000 mg | ORAL_TABLET | Freq: Three times a day (TID) | ORAL | 0 refills | Status: DC

## 2017-10-23 MED ORDER — ONDANSETRON HCL 4 MG/2ML IJ SOLN
4.0000 mg | Freq: Once | INTRAMUSCULAR | Status: AC
  Administered 2017-10-23: 4 mg via INTRAVENOUS
  Filled 2017-10-23: qty 2

## 2017-10-23 MED ORDER — FAMOTIDINE 20 MG PO TABS: 20.0000 mg | ORAL_TABLET | Freq: Two times a day (BID) | ORAL | 0 refills | Status: DC

## 2017-10-23 MED ORDER — HYDROMORPHONE HCL 1 MG/ML IJ SOLN
0.5000 mg | Freq: Once | INTRAMUSCULAR | Status: AC
  Administered 2017-10-23: 0.5 mg via INTRAVENOUS
  Filled 2017-10-23: qty 1

## 2017-10-23 MED ORDER — IOPAMIDOL (ISOVUE-300) INJECTION 61%
100.0000 mL | Freq: Once | INTRAVENOUS | Status: AC | PRN
  Administered 2017-10-23: 100 mL via INTRAVENOUS

## 2017-10-23 NOTE — ED Provider Notes (Signed)
[facility] EMERGENCY DEPARTMENT Provider Note   CSN: [id number] Arrival date & time: 10/23/17  4098     History   Chief Complaint Chief Complaint  Patient presents with  . Abdominal Pain    HPI Michelle Tate is a 22 y.o. female.  Patient is a 22 year old female who presents to the emergency department with a complaint of right upper quadrant and right lower quadrant pain.  The patient was seen in the emergency department on January 4 with right upper quadrant area pain.  She was noted to have gallstones in the gallbladder.  She was treated and is scheduled to have a conference with the surgeon next week.  Since that time the patient has now developed pain to both the right upper quadrant and the right lower quadrant.  This pain is accompanied by nausea.  There is been no fever or chills reported.  No blood in stool.  No change in urination.  The patient states that she is on her menstrual cycle, and at this call has been going on for the past 5 or 6 days.  This is pretty much her usual pattern during her menses.  Patient presents now for further assistance.   The history is provided by the patient.    Past Medical History:  Diagnosis Date  . ADHD (attention deficit hyperactivity disorder)   . Allergy   . Asthma   . Bronchitis   . Chronic headache   . Diabetes mellitus without complication (HCC)   . Hyperinsulinemia   . Hypertension   . Irregular bleeding 02/27/2013  . Morbid obesity (HCC)   . Obesity 02/27/2013    Patient Active Problem List   Diagnosis Date Noted  . Biliary colic   . Adjustment disorder with anxious mood 09/14/2016  . Adjustment disorder with mixed anxiety and depressed mood 08/17/2016  . Vitamin D deficiency 03/26/2016  . Vulvar irritation 03/31/2015  . DUB (dysfunctional uterine bleeding) 02/22/2014  . Irregular bleeding 02/27/2013  . Morbid obesity (HCC) 02/27/2013  . Asthma 02/26/2013  . Type 2 diabetes, uncontrolled, with neuropathy (HCC)  02/20/2013    Past Surgical History:  Procedure Laterality Date  . implanon placement    . TONSILLECTOMY    . WISDOM TOOTH EXTRACTION      OB History    Gravida Para Term Preterm AB Living   0 0 0 0 0 0   SAB TAB Ectopic Multiple Live Births   0 0 0 0 0       Home Medications    Prior to Admission medications   Medication Sig Start Date End Date Taking? Authorizing Provider  acetaminophen (TYLENOL) 325 MG tablet Take 325 mg by mouth every 6 (six) hours as needed.    [provider]  albuterol (PROVENTIL HFA;VENTOLIN HFA) 108 (90 Base) MCG/ACT inhaler Inhale 2 puffs into the lungs every 4 (four) hours as needed for wheezing or shortness of breath. 12/17/15   Campbell Riches, NP  HYDROcodone-acetaminophen (NORCO) 5-325 MG tablet Take 1 tablet by mouth every 4 (four) hours as needed for severe pain. 10/21/17   Loren Racer, MD  ibuprofen (ADVIL,MOTRIN) 800 MG tablet Take 1 tablet (800 mg total) by mouth 3 (three) times daily. Patient taking differently: Take 800 mg by mouth as needed for headache or cramping.  09/21/16   Triplett, Tammy, PA-C  metFORMIN (GLUCOPHAGE XR) 500 MG 24 hr tablet Take 1 tablet (500 mg total) by mouth daily after supper. 03/26/16   Purcell Nails  W, MD  ondansetron (ZOFRAN) 4 MG tablet Take 1 tablet (4 mg total) by mouth every 6 (six) hours as needed for nausea or vomiting. 10/21/17   Loren Racer, MD    Family History Family History  Problem Relation Age of Onset  . Diabetes Paternal Grandmother   . Hypertension Paternal Grandmother   . Hypertension Maternal Grandmother   . Heart disease Father   . Hypertension Father   . Diabetes Father   . Seizures Mother   . Stroke Other   . Cancer Other   . Thyroid disease Cousin     Social History Social History   Tobacco Use  . Smoking status: Current Some Day Smoker    Years: 1.00    Types: Cigarettes  . Smokeless tobacco: Never Used  Substance Use Topics  . Alcohol use: Yes     Alcohol/week: 0.0 oz    Comment: occ  . Drug use: No     Allergies   Banana; Peanut-containing drug products; Penicillins; Sulfa drugs cross reactors; and Vanilla   Review of Systems Review of Systems  Constitutional: Negative for activity change.       All ROS Neg except as noted in HPI  HENT: Negative for nosebleeds.   Eyes: Negative for photophobia and discharge.  Respiratory: Negative for cough, shortness of breath and wheezing.   Cardiovascular: Negative for chest pain and palpitations.  Gastrointestinal: Positive for abdominal pain. Negative for blood in stool.  Genitourinary: Negative for dysuria, frequency and hematuria.  Musculoskeletal: Negative for arthralgias, back pain and neck pain.  Skin: Negative.   Neurological: Negative for dizziness, seizures and speech difficulty.  Psychiatric/Behavioral: Negative for confusion and hallucinations.     Physical Exam Updated Vital Signs BP 128/70   Pulse 85   Temp 98.4 F (36.9 C) (Oral)   Resp 16   Ht 5\' 5"  (1.651 m)   Wt (!) 149.7 kg (330 lb)   LMP 10/15/2017   SpO2 98%   BMI 54.91 kg/m   Physical Exam  Constitutional: She is oriented to person, place, and time. She appears well-developed and well-nourished.  Non-toxic appearance.  HENT:  Head: Normocephalic.  Right Ear: Tympanic membrane and external ear normal.  Left Ear: Tympanic membrane and external ear normal.  Eyes: EOM and lids are normal. Pupils are equal, round, and reactive to light.  Neck: Normal range of motion. Neck supple. Carotid bruit is not present.  Cardiovascular: Normal rate, regular rhythm, normal heart sounds, intact distal pulses and normal pulses.  Pulmonary/Chest: Breath sounds normal. No respiratory distress.  Abdominal: Soft. Bowel sounds are normal. There is tenderness in the right upper quadrant, right lower quadrant and epigastric area. There is no guarding.  Musculoskeletal: Normal range of motion.  Lymphadenopathy:       Head  (right side): No submandibular adenopathy present.       Head (left side): No submandibular adenopathy present.    She has no cervical adenopathy.  Neurological: She is alert and oriented to person, place, and time. She has normal strength. No cranial nerve deficit or sensory deficit.  Skin: Skin is warm and dry.  Psychiatric: She has a normal mood and affect. Her speech is normal.  Nursing note and vitals reviewed.    ED Treatments / Results  Labs (all labs ordered are listed, but only abnormal results are displayed) Labs Reviewed  COMPREHENSIVE METABOLIC PANEL - Abnormal; Notable for the following components:      Result Value   Glucose, Bld  171 (*)    ALT 11 (*)    Total Bilirubin <0.1 (*)    All other components within normal limits  CBC - Abnormal; Notable for the following components:   Hemoglobin 11.3 (*)    MCV 77.2 (*)    MCH 23.6 (*)    RDW 15.6 (*)    All other components within normal limits  URINALYSIS, ROUTINE W REFLEX MICROSCOPIC - Abnormal; Notable for the following components:   APPearance HAZY (*)    Leukocytes, UA SMALL (*)    Bacteria, UA RARE (*)    Squamous Epithelial / LPF 0-5 (*)    All other components within normal limits  LIPASE, BLOOD    EKG  EKG Interpretation None       Radiology US Abdomen Limited  Result Date: 10/21/2017 CLINICAL DATA:  Upper abdominal pain EXAM: ULTRASOUND ABDOMEN LIMITED RIGHT UPPER QUADRANT COMPARISON:  None. FINDINGS: Gallbladder: Multiple layering gallstones. No wall thickening or focal tenderness. Common bile duct: Limited visualization. Maximal diameter of 8 mm where seen. No convincing filling defect. Liver: Diffusely echogenic liver with poor acoustic penetration. No evidence of mass lesion. Portal vein is patent on color Doppler imaging with normal direction of blood flow towards the liver. IMPRESSION: 1. Cholelithiasis. No wall thickening or fluid to suggest acute cholecystitis. The gallbladder is focally tender  and could be obstructed. 2. Upper normal common bile duct diameter of 8 mm. 3. Hepatic steatosis. Electronically Signed   By: Marnee Spring M.D.   On: 10/21/2017 13:28    Procedures Procedures (including critical care time)  Medications Ordered in ED Medications - No data to display   Initial Impression / Assessment and Plan / ED Course  I have reviewed the triage vital signs and the nursing notes.  Pertinent labs & imaging results that were available during my care of the patient were reviewed by me and considered in my medical decision making (see chart for details).       Final Clinical Impressions(s) / ED Diagnoses MDM Vital signs within normal limits.  Patient is having pain of the right upper quadrant, the epigastric, the right lower quadrant abdomen.  Intravenous contaminated and pain medication given to the patient.  There was significant improvement in the patient's pain.  I reviewed the exam from January 4, also reviewed the ultrasound.  The patient will have a CT scan of the abdomen pelvis.  Patient will also have repeat lab work for comparison.  Lipase has improved from January 4.  Currently the lipase is 40.  The comprehensive metabolic panel shows the glucose to be 171, otherwise the conference of metabolic panel is essentially within normal limits.  There are no significant elevations in the AST, ALT, alkaline phosphatase, or total bilirubin.  The complete blood count shows the white blood cells to be 7000.  Hemoglobin is only slightly low at 11.3, hematocrit is normal at 37. The CT scan with contrast shows cholelithiasis without associated inflammatory changes.  The CT scan is otherwise negative.  There is no suspicious lymphadenopathy.  No noted cyst of the ovaries.  Normal appendix.  Patient ambulated in the room and hall without problem.  Patient is given medication for pain and nausea.  Patient is to see Dr. Henreitta Leber next week concerning need for follow-up  emergency department visit.  Patient will return to the emergency department for additional evaluation if not improving.   Final diagnoses:  Right upper quadrant abdominal pain  Right lower quadrant abdominal pain  History of biliary colic    ED Discharge Orders    None       Ivery QualeBryant, Kimberley Speece, PA-C 10/23/17 1901    Bethann BerkshireZammit, Joseph, MD 10/24/17 [phone]

## 2017-10-23 NOTE — ED Notes (Signed)
Awaiting DC paperwork 

## 2017-10-23 NOTE — Discharge Instructions (Signed)
Your vital signs are within normal limits.  Your urine test is negative for infection or kidney stone.  Your complete blood count is within normal limits.  Your CT scan shows stones in the gallbladder, but no other significant or emergent finding.  Please see Dr. Henreitta LeberBridges this week for follow-up and recheck concerning your gallstones and abdominal pain.  Please use Pepcid 2 times daily, ibuprofen 3 times daily with a meal.  Use Norco for more severe pain.  Norco may cause drowsiness.  Please do not drive, operate machinery, drink alcohol, handle legal documents, or participate in activities requiring concentration when taking this medication.

## 2017-10-23 NOTE — ED Notes (Signed)
From CT 

## 2017-10-23 NOTE — ED Triage Notes (Signed)
RUQ pain.  Seen here Friday and diagnosed with gallstones.  States pain is getting worse.

## 2017-10-23 NOTE — ED Notes (Signed)
To CT

## 2018-01-05 DIAGNOSIS — Z6841 Body Mass Index (BMI) 40.0 and over, adult: Secondary | ICD-10-CM | POA: Diagnosis not present

## 2018-01-05 DIAGNOSIS — K8011 Calculus of gallbladder with chronic cholecystitis with obstruction: Secondary | ICD-10-CM | POA: Diagnosis not present

## 2018-01-13 DIAGNOSIS — Z833 Family history of diabetes mellitus: Secondary | ICD-10-CM | POA: Diagnosis not present

## 2018-01-13 DIAGNOSIS — Z882 Allergy status to sulfonamides status: Secondary | ICD-10-CM | POA: Diagnosis not present

## 2018-01-13 DIAGNOSIS — E119 Type 2 diabetes mellitus without complications: Secondary | ICD-10-CM | POA: Diagnosis not present

## 2018-01-13 DIAGNOSIS — Z7984 Long term (current) use of oral hypoglycemic drugs: Secondary | ICD-10-CM | POA: Diagnosis not present

## 2018-01-13 DIAGNOSIS — K8010 Calculus of gallbladder with chronic cholecystitis without obstruction: Secondary | ICD-10-CM | POA: Diagnosis not present

## 2018-01-13 DIAGNOSIS — Z79899 Other long term (current) drug therapy: Secondary | ICD-10-CM | POA: Diagnosis not present

## 2018-01-13 DIAGNOSIS — Z880 Allergy status to penicillin: Secondary | ICD-10-CM | POA: Diagnosis not present

## 2018-01-13 DIAGNOSIS — Z8249 Family history of ischemic heart disease and other diseases of the circulatory system: Secondary | ICD-10-CM | POA: Diagnosis not present

## 2018-01-13 DIAGNOSIS — J45909 Unspecified asthma, uncomplicated: Secondary | ICD-10-CM | POA: Diagnosis not present

## 2018-01-16 DIAGNOSIS — Z833 Family history of diabetes mellitus: Secondary | ICD-10-CM | POA: Diagnosis not present

## 2018-01-16 DIAGNOSIS — Z882 Allergy status to sulfonamides status: Secondary | ICD-10-CM | POA: Diagnosis not present

## 2018-01-16 DIAGNOSIS — Z880 Allergy status to penicillin: Secondary | ICD-10-CM | POA: Diagnosis not present

## 2018-01-16 DIAGNOSIS — Z7984 Long term (current) use of oral hypoglycemic drugs: Secondary | ICD-10-CM | POA: Diagnosis not present

## 2018-01-16 DIAGNOSIS — J45909 Unspecified asthma, uncomplicated: Secondary | ICD-10-CM | POA: Diagnosis not present

## 2018-01-16 DIAGNOSIS — K8010 Calculus of gallbladder with chronic cholecystitis without obstruction: Secondary | ICD-10-CM | POA: Diagnosis not present

## 2018-01-16 DIAGNOSIS — E119 Type 2 diabetes mellitus without complications: Secondary | ICD-10-CM | POA: Diagnosis not present

## 2018-01-16 DIAGNOSIS — Z8249 Family history of ischemic heart disease and other diseases of the circulatory system: Secondary | ICD-10-CM | POA: Diagnosis not present

## 2018-01-16 DIAGNOSIS — Z79899 Other long term (current) drug therapy: Secondary | ICD-10-CM | POA: Diagnosis not present

## 2018-01-16 HISTORY — PX: GALLBLADDER SURGERY: SHX652

## 2018-01-19 ENCOUNTER — Ambulatory Visit: Payer: BLUE CROSS/BLUE SHIELD | Admitting: Family Medicine

## 2018-01-19 ENCOUNTER — Encounter: Payer: Self-pay | Admitting: Family Medicine

## 2018-01-19 VITALS — BP 144/94 | Temp 97.5°F | Ht 64.0 in | Wt 325.1 lb

## 2018-01-19 DIAGNOSIS — I10 Essential (primary) hypertension: Secondary | ICD-10-CM

## 2018-01-19 MED ORDER — AMLODIPINE BESYLATE 2.5 MG PO TABS: 2.5000 mg | ORAL_TABLET | Freq: Every day | ORAL | 2 refills | Status: DC

## 2018-01-19 MED ORDER — FLUCONAZOLE 150 MG PO TABS: ORAL_TABLET | ORAL | 0 refills | Status: DC

## 2018-01-19 NOTE — Progress Notes (Signed)
   Subjective:    Patient ID: Michelle Tate, female    DOB: 11/29/1995, 22 y.o.   MRN: [id number]  HPI Patient is here today due to having elevated bp after having gallbladder surgery.States she is on Antibx,but does not recall the name.Wants diflucan. Patient denies any severe abdominal pain just sore from the surgery She states she does have family history of blood pressure Patient denies smoking tobacco She admits to smoking marijuana but only recreational She denies excessive salt intake she is trying to lose weight She suffers with morbid obesity  Blood pressure was elevated at the hospital when she had a gallbladder removed Review of Systems  Constitutional: Negative for activity change, fatigue and fever.  HENT: Negative for congestion.   Respiratory: Negative for cough, chest tightness and shortness of breath.   Cardiovascular: Negative for chest pain and leg swelling.  Gastrointestinal: Negative for abdominal pain.  Skin: Negative for color change.  Neurological: Negative for headaches.  Psychiatric/Behavioral: Negative for behavioral problems.       Objective:   Physical Exam  Constitutional: She appears well-developed and well-nourished. No distress.  HENT:  Head: Normocephalic and atraumatic.  Eyes: Right eye exhibits no discharge. Left eye exhibits no discharge.  Neck: No tracheal deviation present.  Cardiovascular: Normal rate, regular rhythm and normal heart sounds.  No murmur heard. Pulmonary/Chest: Effort normal and breath sounds normal. No respiratory distress. She has no wheezes. She has no rales.  Musculoskeletal: She exhibits no edema.  Lymphadenopathy:    She has no cervical adenopathy.  Neurological: She is alert. She exhibits normal muscle tone.  Skin: Skin is warm and dry. No erythema.  Psychiatric: Her behavior is normal.  Vitals reviewed.   Blood pressure was checked several times on the exam table and on a side chair after several minutes of  sitting best reading was 144/94      Assessment & Plan:  HTN-increased risk of long-term cardiac or kidney problems because of this unless treated therefore reduce salt exercise try to lose weight in addition to this start amlodipine 2.5 mg daily  Reported yeast infection Diflucan as directed  Morbid obesity importance of losing weight regular follow-up visits  Follow-up in 2 weeks time to recheck blood pressure no lab work on today's visit

## 2018-01-19 NOTE — Patient Instructions (Signed)
DASH Eating Plan DASH stands for "Dietary Approaches to Stop Hypertension." The DASH eating plan is a healthy eating plan that has been shown to reduce high blood pressure (hypertension). It may also reduce your risk for type 2 diabetes, heart disease, and stroke. The DASH eating plan may also help with weight loss. What are tips for following this plan? General guidelines  Avoid eating more than 2,300 mg (milligrams) of salt (sodium) a day. If you have hypertension, you may need to reduce your sodium intake to 1,500 mg a day.  Limit alcohol intake to no more than 1 drink a day for nonpregnant women and 2 drinks a day for men. One drink equals 12 oz of beer, 5 oz of wine, or 1 oz of hard liquor.  Work with your health care provider to maintain a healthy body weight or to lose weight. Ask what an ideal weight is for you.  Get at least 30 minutes of exercise that causes your heart to beat faster (aerobic exercise) most days of the week. Activities may include walking, swimming, or biking.  Work with your health care provider or diet and nutrition specialist (dietitian) to adjust your eating plan to your individual calorie needs. Reading food labels  Check food labels for the amount of sodium per serving. Choose foods with less than 5 percent of the Daily Value of sodium. Generally, foods with less than 300 mg of sodium per serving fit into this eating plan.  To find whole grains, look for the word "whole" as the first word in the ingredient list. Shopping  Buy products labeled as "low-sodium" or "no salt added."  Buy fresh foods. Avoid canned foods and premade or frozen meals. Cooking  Avoid adding salt when cooking. Use salt-free seasonings or herbs instead of table salt or sea salt. Check with your health care provider or pharmacist before using salt substitutes.  Do not fry foods. Cook foods using healthy methods such as baking, boiling, grilling, and broiling instead.  Cook with  heart-healthy oils, such as olive, canola, soybean, or sunflower oil. Meal planning   Eat a balanced diet that includes: ? 5 or more servings of fruits and vegetables each day. At each meal, try to fill half of your plate with fruits and vegetables. ? Up to 6-8 servings of whole grains each day. ? Less than 6 oz of lean meat, poultry, or fish each day. A 3-oz serving of meat is about the same size as a deck of cards. One egg equals 1 oz. ? 2 servings of low-fat dairy each day. ? A serving of nuts, seeds, or beans 5 times each week. ? Heart-healthy fats. Healthy fats called Omega-3 fatty acids are found in foods such as flaxseeds and coldwater fish, like sardines, salmon, and mackerel.  Limit how much you eat of the following: ? Canned or prepackaged foods. ? Food that is high in trans fat, such as fried foods. ? Food that is high in saturated fat, such as fatty meat. ? Sweets, desserts, sugary drinks, and other foods with added sugar. ? Full-fat dairy products.  Do not salt foods before eating.  Try to eat at least 2 vegetarian meals each week.  Eat more home-cooked food and less restaurant, buffet, and fast food.  When eating at a restaurant, ask that your food be prepared with less salt or no salt, if possible. What foods are recommended? The items listed may not be a complete list. Talk with your dietitian about what   dietary choices are best for you. Grains Whole-grain or whole-wheat bread. Whole-grain or whole-wheat pasta. Brown rice. Oatmeal. Quinoa. Bulgur. Whole-grain and low-sodium cereals. Pita bread. Low-fat, low-sodium crackers. Whole-wheat flour tortillas. Vegetables Fresh or frozen vegetables (raw, steamed, roasted, or grilled). Low-sodium or reduced-sodium tomato and vegetable juice. Low-sodium or reduced-sodium tomato sauce and tomato paste. Low-sodium or reduced-sodium canned vegetables. Fruits All fresh, dried, or frozen fruit. Canned fruit in natural juice (without  added sugar). Meat and other protein foods Skinless chicken or turkey. Ground chicken or turkey. Pork with fat trimmed off. Fish and seafood. Egg whites. Dried beans, peas, or lentils. Unsalted nuts, nut butters, and seeds. Unsalted canned beans. Lean cuts of beef with fat trimmed off. Low-sodium, lean deli meat. Dairy Low-fat (1%) or fat-free (skim) milk. Fat-free, low-fat, or reduced-fat cheeses. Nonfat, low-sodium ricotta or cottage cheese. Low-fat or nonfat yogurt. Low-fat, low-sodium cheese. Fats and oils Soft margarine without trans fats. Vegetable oil. Low-fat, reduced-fat, or light mayonnaise and salad dressings (reduced-sodium). Canola, safflower, olive, soybean, and sunflower oils. Avocado. Seasoning and other foods Herbs. Spices. Seasoning mixes without salt. Unsalted popcorn and pretzels. Fat-free sweets. What foods are not recommended? The items listed may not be a complete list. Talk with your dietitian about what dietary choices are best for you. Grains Baked goods made with fat, such as croissants, muffins, or some breads. Dry pasta or rice meal packs. Vegetables Creamed or fried vegetables. Vegetables in a cheese sauce. Regular canned vegetables (not low-sodium or reduced-sodium). Regular canned tomato sauce and paste (not low-sodium or reduced-sodium). Regular tomato and vegetable juice (not low-sodium or reduced-sodium). Pickles. Olives. Fruits Canned fruit in a light or heavy syrup. Fried fruit. Fruit in cream or butter sauce. Meat and other protein foods Fatty cuts of meat. Ribs. Fried meat. Bacon. Sausage. Bologna and other processed lunch meats. Salami. Fatback. Hotdogs. Bratwurst. Salted nuts and seeds. Canned beans with added salt. Canned or smoked fish. Whole eggs or egg yolks. Chicken or turkey with skin. Dairy Whole or 2% milk, cream, and half-and-half. Whole or full-fat cream cheese. Whole-fat or sweetened yogurt. Full-fat cheese. Nondairy creamers. Whipped toppings.  Processed cheese and cheese spreads. Fats and oils Butter. Stick margarine. Lard. Shortening. Ghee. Bacon fat. Tropical oils, such as coconut, palm kernel, or palm oil. Seasoning and other foods Salted popcorn and pretzels. Onion salt, garlic salt, seasoned salt, table salt, and sea salt. Worcestershire sauce. Tartar sauce. Barbecue sauce. Teriyaki sauce. Soy sauce, including reduced-sodium. Steak sauce. Canned and packaged gravies. Fish sauce. Oyster sauce. Cocktail sauce. Horseradish that you find on the shelf. Ketchup. Mustard. Meat flavorings and tenderizers. Bouillon cubes. Hot sauce and Tabasco sauce. Premade or packaged marinades. Premade or packaged taco seasonings. Relishes. Regular salad dressings. Where to find more information:  National Heart, Lung, and Blood Institute: www.nhlbi.nih.gov  American Heart Association: www.heart.org Summary  The DASH eating plan is a healthy eating plan that has been shown to reduce high blood pressure (hypertension). It may also reduce your risk for type 2 diabetes, heart disease, and stroke.  With the DASH eating plan, you should limit salt (sodium) intake to 2,300 mg a day. If you have hypertension, you may need to reduce your sodium intake to 1,500 mg a day.  When on the DASH eating plan, aim to eat more fresh fruits and vegetables, whole grains, lean proteins, low-fat dairy, and heart-healthy fats.  Work with your health care provider or diet and nutrition specialist (dietitian) to adjust your eating plan to your individual   calorie needs. This information is not intended to replace advice given to you by your health care provider. Make sure you discuss any questions you have with your health care provider. Document Released: 09/23/2011 Document Revised: 09/27/2016 Document Reviewed: 09/27/2016 Elsevier Interactive Patient Education  2018 [employer]  

## 2018-01-30 ENCOUNTER — Encounter: Payer: Self-pay | Admitting: Nurse Practitioner

## 2018-01-30 ENCOUNTER — Ambulatory Visit: Payer: BLUE CROSS/BLUE SHIELD | Admitting: Nurse Practitioner

## 2018-01-30 VITALS — BP 134/84 | Ht 64.0 in | Wt 324.8 lb

## 2018-01-30 DIAGNOSIS — I10 Essential (primary) hypertension: Secondary | ICD-10-CM

## 2018-01-30 DIAGNOSIS — E1165 Type 2 diabetes mellitus with hyperglycemia: Secondary | ICD-10-CM

## 2018-01-30 DIAGNOSIS — E1140 Type 2 diabetes mellitus with diabetic neuropathy, unspecified: Secondary | ICD-10-CM | POA: Diagnosis not present

## 2018-01-30 DIAGNOSIS — IMO0002 Reserved for concepts with insufficient information to code with codable children: Secondary | ICD-10-CM

## 2018-01-30 LAB — POCT GLYCOSYLATED HEMOGLOBIN (HGB A1C): Hemoglobin A1C: 6

## 2018-01-30 MED ORDER — LISINOPRIL 5 MG PO TABS: 5.0000 mg | ORAL_TABLET | Freq: Every day | ORAL | 1 refills | Status: DC

## 2018-01-30 NOTE — Progress Notes (Signed)
Subjective: Presents for recheck on her hypertension see previous note.  Taking amlodipine 2.5 mg.  Has noticed increased night sweats and slight edema in her feet since starting medication.  Has been working hard on her weight loss.  Eating lean protein and fruits and vegetables.  Has also tried increasing her activity.  States her blood sugars have been normal.  Objective:   BP 134/84   Ht 5\' 4"  (1.626 m)   Wt (!) 324 lb 12.8 oz (147.3 kg)   BMI 55.75 kg/m  NAD.  Alert, oriented.  Cheerful affect.  Lungs clear.  Heart regular rate and rhythm. Results for orders placed or performed in visit on 01/30/18  POCT glycosylated hemoglobin (Hb A1C)  Result Value Ref Range   Hemoglobin A1C 6.0      Assessment:   Problem List Items Addressed This Visit      Cardiovascular and Mediastinum   Essential hypertension - Primary   Relevant Medications   lisinopril (PRINIVIL,ZESTRIL) 5 MG tablet     Endocrine   Type 2 diabetes, uncontrolled, with neuropathy (HCC)   Relevant Medications   lisinopril (PRINIVIL,ZESTRIL) 5 MG tablet   Other Relevant Orders   POCT glycosylated hemoglobin (Hb A1C) (Completed)        Plan:   Meds ordered this encounter  Medications  . lisinopril (PRINIVIL,ZESTRIL) 5 MG tablet    Sig: Take 1 tablet (5 mg total) by mouth daily.    Dispense:  90 tablet    Refill:  1    Order Specific Question:   Supervising Provider    Answer:   Merlyn AlbertLUKING, WILLIAM S [[id number]]   Stop amlodipine due to possible side effects.  Switch to lisinopril which will also offer nephro protective benefits.  Patient to monitor BP outside of office and call back if it remains above 140/90.  Encouraged continued healthy diet and weight loss efforts. Return in about 6 months (around 08/01/2018) for recheck.

## 2018-01-30 NOTE — Patient Instructions (Signed)
Stop Amlodipine; start Lisinopril; watch BP; if above 140/90 call back

## 2018-04-21 ENCOUNTER — Ambulatory Visit: Payer: BLUE CROSS/BLUE SHIELD | Admitting: Adult Health

## 2018-05-01 ENCOUNTER — Ambulatory Visit (INDEPENDENT_AMBULATORY_CARE_PROVIDER_SITE_OTHER): Payer: BLUE CROSS/BLUE SHIELD | Admitting: Adult Health

## 2018-05-01 ENCOUNTER — Encounter: Payer: Self-pay | Admitting: Adult Health

## 2018-05-01 VITALS — BP 129/67 | HR 93 | Ht 66.0 in | Wt 334.5 lb

## 2018-05-01 DIAGNOSIS — Z3202 Encounter for pregnancy test, result negative: Secondary | ICD-10-CM

## 2018-05-01 DIAGNOSIS — Z3169 Encounter for other general counseling and advice on procreation: Secondary | ICD-10-CM | POA: Diagnosis not present

## 2018-05-01 DIAGNOSIS — R112 Nausea with vomiting, unspecified: Secondary | ICD-10-CM

## 2018-05-01 DIAGNOSIS — Z319 Encounter for procreative management, unspecified: Secondary | ICD-10-CM

## 2018-05-01 LAB — POCT WET PREP (WET MOUNT)

## 2018-05-01 LAB — POCT URINE PREGNANCY: PREG TEST UR: NEGATIVE
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
## 2018-06-14 ENCOUNTER — Other Ambulatory Visit: Payer: BLUE CROSS/BLUE SHIELD | Admitting: Adult Health

## 2018-07-31 ENCOUNTER — Ambulatory Visit: Payer: BLUE CROSS/BLUE SHIELD | Admitting: Nurse Practitioner

## 2018-09-06 ENCOUNTER — Other Ambulatory Visit: Payer: BLUE CROSS/BLUE SHIELD | Admitting: Adult Health

## 2018-09-30 ENCOUNTER — Other Ambulatory Visit: Payer: Self-pay

## 2018-09-30 ENCOUNTER — Emergency Department (HOSPITAL_COMMUNITY): Payer: BLUE CROSS/BLUE SHIELD

## 2018-09-30 ENCOUNTER — Encounter (HOSPITAL_COMMUNITY): Payer: Self-pay | Admitting: Emergency Medicine

## 2018-09-30 ENCOUNTER — Emergency Department (HOSPITAL_COMMUNITY)
Admission: EM | Admit: 2018-09-30 | Discharge: 2018-09-30 | Disposition: A | Discharge status: Home / Self Care | Payer: BLUE CROSS/BLUE SHIELD | Attending: Emergency Medicine | Admitting: Emergency Medicine

## 2018-09-30 DIAGNOSIS — S71132A Puncture wound without foreign body, left thigh, initial encounter: Secondary | ICD-10-CM

## 2018-09-30 DIAGNOSIS — W3400XA Accidental discharge from unspecified firearms or gun, initial encounter: Secondary | ICD-10-CM | POA: Insufficient documentation

## 2018-09-30 DIAGNOSIS — Z7984 Long term (current) use of oral hypoglycemic drugs: Secondary | ICD-10-CM | POA: Insufficient documentation

## 2018-09-30 DIAGNOSIS — Z87891 Personal history of nicotine dependence: Secondary | ICD-10-CM | POA: Diagnosis not present

## 2018-09-30 DIAGNOSIS — E1140 Type 2 diabetes mellitus with diabetic neuropathy, unspecified: Secondary | ICD-10-CM | POA: Insufficient documentation

## 2018-09-30 DIAGNOSIS — Y929 Unspecified place or not applicable: Secondary | ICD-10-CM | POA: Diagnosis not present

## 2018-09-30 DIAGNOSIS — Z91010 Allergy to peanuts: Secondary | ICD-10-CM | POA: Diagnosis not present

## 2018-09-30 DIAGNOSIS — Z79899 Other long term (current) drug therapy: Secondary | ICD-10-CM | POA: Diagnosis not present

## 2018-09-30 DIAGNOSIS — F1210 Cannabis abuse, uncomplicated: Secondary | ICD-10-CM | POA: Diagnosis not present

## 2018-09-30 DIAGNOSIS — Y998 Other external cause status: Secondary | ICD-10-CM | POA: Diagnosis not present

## 2018-09-30 DIAGNOSIS — I10 Essential (primary) hypertension: Secondary | ICD-10-CM | POA: Insufficient documentation

## 2018-09-30 DIAGNOSIS — Z23 Encounter for immunization: Secondary | ICD-10-CM | POA: Diagnosis not present

## 2018-09-30 DIAGNOSIS — S79922A Unspecified injury of left thigh, initial encounter: Secondary | ICD-10-CM | POA: Diagnosis not present

## 2018-09-30 DIAGNOSIS — Y9389 Activity, other specified: Secondary | ICD-10-CM | POA: Insufficient documentation

## 2018-09-30 DIAGNOSIS — S71139A Puncture wound without foreign body, unspecified thigh, initial encounter: Secondary | ICD-10-CM

## 2018-09-30 DIAGNOSIS — S71102A Unspecified open wound, left thigh, initial encounter: Secondary | ICD-10-CM | POA: Diagnosis not present

## 2018-09-30 HISTORY — DX: Puncture wound without foreign body, unspecified thigh, initial encounter: S71.139A

## 2018-09-30 LAB — ETHANOL: Alcohol, Ethyl (B): <10 mg/dL (ref <10)

## 2018-09-30 LAB — URINALYSIS, ROUTINE W REFLEX MICROSCOPIC
Bacteria, UA: NONE SEEN
Bilirubin Urine: NEGATIVE
Glucose, UA: NEGATIVE mg/dL
Hgb urine dipstick: NEGATIVE
KETONES UR: NEGATIVE mg/dL
Nitrite: NEGATIVE
PH: 6 (ref 5.0–8.0)
Protein, ur: NEGATIVE mg/dL
Specific Gravity, Urine: >1.046 — ABNORMAL HIGH (ref 1.005–1.030)

## 2018-09-30 LAB — CBC
HCT: 42.4 % (ref 36.0–46.0)
Hemoglobin: 13 g/dL (ref 12.0–15.0)
MCH: 24.1 pg — ABNORMAL LOW (ref 26.0–34.0)
MCHC: 30.7 g/dL (ref 30.0–36.0)
MCV: 78.5 fL — ABNORMAL LOW (ref 80.0–100.0)
Platelets: 414 10*3/uL — ABNORMAL HIGH (ref 150–400)
RBC: 5.4 MIL/uL — ABNORMAL HIGH (ref 3.87–5.11)
RDW: 14.6 % (ref 11.5–15.5)
WBC: 14 10*3/uL — ABNORMAL HIGH (ref 4.0–10.5)
nRBC: 0 % (ref 0.0–0.2)

## 2018-09-30 LAB — COMPREHENSIVE METABOLIC PANEL
ALBUMIN: 4 g/dL (ref 3.5–5.0)
ALT: 12 U/L (ref 0–44)
AST: 21 U/L (ref 15–41)
Alkaline Phosphatase: 95 U/L (ref 38–126)
Anion gap: 9 (ref 5–15)
BILIRUBIN TOTAL: 0.5 mg/dL (ref 0.3–1.2)
BUN: 13 mg/dL (ref 6–20)
CO2: 22 mmol/L (ref 22–32)
Calcium: 9.1 mg/dL (ref 8.9–10.3)
Chloride: 104 mmol/L (ref 98–111)
Creatinine, Ser: 0.73 mg/dL (ref 0.44–1.00)
GFR calc Af Amer: >60 mL/min (ref >60)
GFR calc non Af Amer: >60 mL/min (ref >60)
GLUCOSE: 204 mg/dL — AB (ref 70–99)
Potassium: 3.6 mmol/L (ref 3.5–5.1)
Sodium: 135 mmol/L (ref 135–145)
Total Protein: 8.3 g/dL — ABNORMAL HIGH (ref 6.5–8.1)

## 2018-09-30 LAB — I-STAT CG4 LACTIC ACID, ED
Lactic Acid, Venous: 2.72 mmol/L (ref 0.5–1.9)
Lactic Acid, Venous: 2.24 mmol/L (ref 0.5–1.9)

## 2018-09-30 LAB — I-STAT CHEM 8, ED
BUN: 12 mg/dL (ref 6–20)
Calcium, Ion: 1.17 mmol/L (ref 1.15–1.40)
Chloride: 103 mmol/L (ref 98–111)
Creatinine, Ser: 0.6 mg/dL (ref 0.44–1.00)
Glucose, Bld: 206 mg/dL — ABNORMAL HIGH (ref 70–99)
HCT: 43 % (ref 36.0–46.0)
Hemoglobin: 14.6 g/dL (ref 12.0–15.0)
Potassium: 3.8 mmol/L (ref 3.5–5.1)
Sodium: 139 mmol/L (ref 135–145)
TCO2: 25 mmol/L (ref 22–32)

## 2018-09-30 LAB — SAMPLE TO BLOOD BANK

## 2018-09-30 LAB — I-STAT BETA HCG BLOOD, ED (MC, WL, AP ONLY): I-stat hCG, quantitative: <5 m[IU]/mL (ref <5)

## 2018-09-30 LAB — PROTIME-INR
INR: 1.03
Prothrombin Time: 13.4 seconds (ref 11.4–15.2)

## 2018-09-30 LAB — CDS SEROLOGY

## 2018-09-30 MED ORDER — CLINDAMYCIN PHOSPHATE 600 MG/50ML IV SOLN
600.0000 mg | Freq: Once | INTRAVENOUS | Status: AC
  Administered 2018-09-30: 600 mg via INTRAVENOUS
  Filled 2018-09-30: qty 50

## 2018-09-30 MED ORDER — TETANUS-DIPHTH-ACELL PERTUSSIS 5-2.5-18.5 LF-MCG/0.5 IM SUSP
0.5000 mL | Freq: Once | INTRAMUSCULAR | Status: AC
  Administered 2018-09-30: 0.5 mL via INTRAMUSCULAR
  Filled 2018-09-30: qty 0.5

## 2018-09-30 MED ORDER — CLINDAMYCIN HCL 150 MG PO CAPS: 450.0000 mg | ORAL_CAPSULE | Freq: Three times a day (TID) | ORAL | 0 refills | Status: AC

## 2018-09-30 MED ORDER — SODIUM CHLORIDE 0.9 % IV BOLUS
1000.0000 mL | Freq: Once | INTRAVENOUS | Status: AC
  Administered 2018-09-30: 1000 mL via INTRAVENOUS

## 2018-09-30 MED ORDER — OXYCODONE-ACETAMINOPHEN 5-325 MG PO TABS
2.0000 | ORAL_TABLET | Freq: Once | ORAL | Status: AC
  Administered 2018-09-30: 2 via ORAL
  Filled 2018-09-30: qty 2

## 2018-09-30 MED ORDER — SODIUM CHLORIDE 0.9 % IV BOLUS
125.0000 mL | Freq: Once | INTRAVENOUS | Status: AC
  Administered 2018-09-30: 125 mL via INTRAVENOUS

## 2018-09-30 MED ORDER — IOPAMIDOL (ISOVUE-370) INJECTION 76%
100.0000 mL | Freq: Once | INTRAVENOUS | Status: AC | PRN
  Administered 2018-09-30: 100 mL via INTRAVENOUS

## 2018-09-30 MED ORDER — OXYCODONE-ACETAMINOPHEN 5-325 MG PO TABS: ORAL_TABLET | ORAL | 0 refills | Status: DC

## 2018-09-30 MED ORDER — MORPHINE SULFATE (PF) 4 MG/ML IV SOLN
4.0000 mg | INTRAVENOUS | Status: DC | PRN
  Administered 2018-09-30: 4 mg via INTRAVENOUS
  Filled 2018-09-30: qty 1

## 2018-09-30 NOTE — ED Notes (Signed)
Patient transported to CT 
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
## 2018-09-30 NOTE — ED Notes (Signed)
Date and time results received: 09/30/18 2004  Test: lactic acid Critical Value: 2.24  Name of Provider Notified: Dr Clarene DukeMcManus  Orders Received? Or Actions Taken?: Actions Taken: none-no new orders.
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
## 2018-09-30 NOTE — ED Notes (Signed)
CRITICAL RESULT  09/30/2018 1707  Lactic  Result: 2.72  MD Notified: Merlene LaughterK. McManus

## 2018-10-06 ENCOUNTER — Ambulatory Visit: Payer: BLUE CROSS/BLUE SHIELD | Admitting: Family Medicine

## 2018-10-09 ENCOUNTER — Encounter: Payer: Self-pay | Admitting: Family Medicine

## 2018-10-09 ENCOUNTER — Ambulatory Visit: Payer: BLUE CROSS/BLUE SHIELD | Admitting: Family Medicine

## 2018-10-09 VITALS — BP 138/100 | Ht 64.0 in | Wt 341.0 lb

## 2018-10-09 DIAGNOSIS — M79652 Pain in left thigh: Secondary | ICD-10-CM

## 2018-10-09 DIAGNOSIS — S71132D Puncture wound without foreign body, left thigh, subsequent encounter: Secondary | ICD-10-CM | POA: Diagnosis not present

## 2018-10-09 DIAGNOSIS — W3400XD Accidental discharge from unspecified firearms or gun, subsequent encounter: Secondary | ICD-10-CM

## 2018-10-09 MED ORDER — FLUCONAZOLE 150 MG PO TABS: ORAL_TABLET | ORAL | 0 refills | Status: DC
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
## 2018-10-16 ENCOUNTER — Encounter: Payer: Self-pay | Admitting: Family Medicine

## 2018-10-16 ENCOUNTER — Ambulatory Visit: Payer: BLUE CROSS/BLUE SHIELD | Admitting: Family Medicine

## 2018-10-16 ENCOUNTER — Telehealth: Payer: Self-pay | Admitting: Adult Health

## 2018-10-16 ENCOUNTER — Telehealth: Payer: Self-pay | Admitting: *Deleted

## 2018-10-16 DIAGNOSIS — F321 Major depressive disorder, single episode, moderate: Secondary | ICD-10-CM | POA: Diagnosis not present

## 2018-10-16 DIAGNOSIS — F411 Generalized anxiety disorder: Secondary | ICD-10-CM | POA: Diagnosis not present

## 2018-10-16 MED ORDER — BUPROPION HCL ER (SR) 150 MG PO TB12: ORAL_TABLET | ORAL | 5 refills | Status: DC
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
## 2018-10-16 NOTE — Telephone Encounter (Signed)
Patient came to front desk to see if we can call in rx for yeast. Discussed patient with Michelle DikeJennifer, she stated that patient needs to be seen or she can try otc monistat.

## 2018-10-16 NOTE — Telephone Encounter (Signed)
Pt seen at Dr Fletcher AnonLuking's office.

## 2018-10-16 NOTE — Telephone Encounter (Signed)
Pt called in regards to yeast infection, states she has been seen for this before and the issue is still reoccurring, she was inquirng if prescription could be sent to [facility]

## 2018-10-17 ENCOUNTER — Telehealth: Payer: Self-pay | Admitting: *Deleted

## 2018-10-17 ENCOUNTER — Encounter: Payer: Self-pay | Admitting: Advanced Practice Midwife

## 2018-10-17 ENCOUNTER — Ambulatory Visit (INDEPENDENT_AMBULATORY_CARE_PROVIDER_SITE_OTHER): Payer: BLUE CROSS/BLUE SHIELD | Admitting: Advanced Practice Midwife

## 2018-10-17 VITALS — BP 148/106 | HR 94 | Ht 66.0 in | Wt 330.0 lb

## 2018-10-17 DIAGNOSIS — N898 Other specified noninflammatory disorders of vagina: Secondary | ICD-10-CM | POA: Diagnosis not present

## 2018-10-17 MED ORDER — LIDOCAINE 5 % EX CREA: 1.0000 "application " | TOPICAL_CREAM | CUTANEOUS | 2 refills | Status: DC | PRN

## 2018-10-17 MED ORDER — VALACYCLOVIR HCL 1 G PO TABS: 1000.0000 mg | ORAL_TABLET | Freq: Two times a day (BID) | ORAL | 0 refills | Status: DC

## 2018-10-17 MED ORDER — LIDOCAINE 5 % EX OINT: 1.0000 "application " | TOPICAL_OINTMENT | CUTANEOUS | 1 refills | Status: DC | PRN

## 2018-10-17 NOTE — Patient Instructions (Signed)
Genital Herpes °Genital herpes is a common sexually transmitted infection (STI) that is caused by a virus. The virus spreads from person to person through sexual contact. Infection can cause itching, blisters, and sores around the genitals or rectum. Symptoms may last several days and then go away This is called an outbreak. However, the virus remains in your body, so you may have more outbreaks in the future. The time between outbreaks varies and can be months or years. °Genital herpes affects men and women. It is particularly concerning for pregnant women because the virus can be passed to the baby during delivery and can cause serious problems. Genital herpes is also a concern for people who have a weak disease-fighting (immune) system. °What are the causes? °This condition is caused by the herpes simplex virus (HSV) type 1 or type 2. The virus may spread through: °· Sexual contact with an infected person, including vaginal, anal, and oral sex. °· Contact with fluid from a herpes sore. °· The skin. This means that you can get herpes from an infected partner even if he or she does not have a visible sore or does not know that he or she is infected. °What increases the risk? °You are more likely to develop this condition if: °· You have sex with many partners. °· You do not use latex condoms during sex. °What are the signs or symptoms? °Most people do not have symptoms (asymptomatic) or have mild symptoms that may be mistaken for other skin problems. Symptoms may include: °· Small red bumps near the genitals, rectum, or mouth. These bumps turn into blisters and then turn into sores. °· Flu-like symptoms, including: °? Fever. °? Body aches. °? Swollen lymph nodes. °? Headache. °· Painful urination. °· Pain and itching in the genital area or rectal area. °· Vaginal discharge. °· Tingling or shooting pain in the legs and buttocks. °Generally, symptoms are more severe and last longer during the first (primary)  outbreak. Flu-like symptoms are also more common during the primary outbreak. °How is this diagnosed? °Genital herpes may be diagnosed based on: °· A physical exam. °· Your medical history. °· Blood tests. °· A test of a fluid sample (culture) from an open sore. °How is this treated? °There is no cure for this condition, but treatment with antiviral medicines that are taken by mouth (orally) can do the following: °· Speed up healing and relieve symptoms. °· Help to reduce the spread of the virus to sexual partners. °· Limit the chance of future outbreaks, or make future outbreaks shorter. °· Lessen symptoms of future outbreaks. °Your health care provider may also recommend pain relief medicines, such as aspirin or ibuprofen. °Follow these instructions at home: °Sexual activity °· Do not have sexual contact during active outbreaks. °· Practice safe sex. Latex condoms and female condoms may help prevent the spread of the herpes virus. °General instructions °· Keep the affected areas dry and clean. °· Take over-the-counter and prescription medicines only as told by your health care provider. °· Avoid rubbing or touching blisters and sores. If you do touch blisters or sores: °? Wash your hands thoroughly with soap and water. °? Do not touch your eyes afterward. °· To help relieve pain or itching, you may take the following actions as directed by your health care provider: °? Apply a cold, wet cloth (cold compress) to affected areas 4-6 times a day. °? Apply a substance that protects your skin and reduces bleeding (astringent). °? Apply a gel that   helps relieve pain around sores (lidocaine gel). °? Take a warm, shallow bath that cleans the genital area (sitz bath). °· Keep all follow-up visits as told by your health care provider. This is important. °How is this prevented? °· Use condoms. Although anyone can get genital herpes during sexual contact, even with the use of a condom, a condom can provide some  protection. °· Avoid having multiple sexual partners. °· Talk with your sexual partner about any symptoms either of you may have. Also, talk with your partner about any history of STIs. °· Get tested for STIs before you have sex. Ask your partner to do the same. °· Do not have sexual contact if you have symptoms of genital herpes. °Contact a health care provider if: °· Your symptoms are not improving with medicine. °· Your symptoms return. °· You have new symptoms. °· You have a fever. °· You have abdominal pain. °· You have redness, swelling, or pain in your eye. °· You notice new sores on other parts of your body. °· You are a woman and experience bleeding between menstrual periods. °· You have had herpes and you become pregnant or plan to become pregnant. °Summary °· Genital herpes is a common sexually transmitted infection (STI) that is caused by the herpes simplex virus (HSV) type 1 or type 2. °· These viruses are most often spread through sexual contact with an infected person. °· You are more likely to develop this condition if you have sex with many partners or you have unprotected sex. °· Most people do not have symptoms (asymptomatic) or have mild symptoms that may be mistaken for other skin problems. Symptoms occur as outbreaks that may happen months or years apart. °· There is no cure for this condition, but treatment with oral antiviral medicines can reduce symptoms, reduce the chance of spreading the virus to a partner, prevent future outbreaks, or shorten future outbreaks. °This information is not intended to replace advice given to you by your health care provider. Make sure you discuss any questions you have with your health care provider. °Document Released: 10/01/2000 Document Revised: 09/03/2016 Document Reviewed: 09/03/2016 °Elsevier Interactive Patient Education © 2019 [employer] ° °

## 2018-10-17 NOTE — Addendum Note (Signed)
Addended by: Jacklyn ShellRESENZO-DISHMON, Eliora Nienhuis on: 10/17/2018 03:50 PM   Modules accepted: Orders
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
## 2018-10-19 LAB — HERPES SIMPLEX VIRUS CULTURE

## 2018-10-20 ENCOUNTER — Encounter: Payer: Self-pay | Admitting: Advanced Practice Midwife

## 2018-10-20 DIAGNOSIS — A6000 Herpesviral infection of urogenital system, unspecified: Secondary | ICD-10-CM | POA: Insufficient documentation

## 2018-10-30 ENCOUNTER — Other Ambulatory Visit: Payer: Self-pay

## 2018-10-30 ENCOUNTER — Telehealth: Payer: Self-pay | Admitting: *Deleted

## 2018-10-30 NOTE — Telephone Encounter (Signed)
Called patient back and heard message that voicemail is full. Will send mychart message to patient.

## 2018-10-31 ENCOUNTER — Telehealth: Payer: Self-pay | Admitting: *Deleted

## 2018-10-31 NOTE — Telephone Encounter (Signed)
Pt states that she finished the Rx she was prescribed for the HSV outbreak. She is asking if she should continue taking the medication to act as a suppressant. Advised that I would send her question to Drenda Freeze, who will be in the office tomorrow, and call her back with Fran's recommendations. Pt verbalized understanding.

## 2018-10-31 NOTE — Telephone Encounter (Signed)
Patient called back, stated that she has cleaned her messages off.  Please call back.  [phone]

## 2018-10-31 NOTE — Telephone Encounter (Signed)
Attempted to return call. Mailbox full. Unable to leave message

## 2018-11-01 ENCOUNTER — Other Ambulatory Visit: Payer: Self-pay | Admitting: Advanced Practice Midwife

## 2018-11-01 MED ORDER — VALACYCLOVIR HCL 500 MG PO TABS: 500.0000 mg | ORAL_TABLET | Freq: Every day | ORAL | 11 refills | Status: DC

## 2018-11-01 NOTE — Progress Notes (Signed)
Wants to take suppression dosage valtrex.  rx 500mg  qd

## 2018-11-15 ENCOUNTER — Ambulatory Visit (HOSPITAL_COMMUNITY): Payer: Self-pay | Admitting: Psychiatry

## 2018-12-20 NOTE — Telephone Encounter (Signed)
Note sent to nurse. 

## 2019-04-04 ENCOUNTER — Other Ambulatory Visit: Payer: Self-pay

## 2019-04-04 ENCOUNTER — Other Ambulatory Visit: Payer: Self-pay | Admitting: Internal Medicine

## 2019-04-04 DIAGNOSIS — Z20822 Contact with and (suspected) exposure to covid-19: Secondary | ICD-10-CM

## 2019-04-07 LAB — NOVEL CORONAVIRUS, NAA: SARS-CoV-2, NAA: NOT DETECTED

## 2019-07-25 ENCOUNTER — Other Ambulatory Visit: Payer: Self-pay

## 2019-07-25 DIAGNOSIS — Z20822 Contact with and (suspected) exposure to covid-19: Secondary | ICD-10-CM

## 2019-07-27 LAB — NOVEL CORONAVIRUS, NAA: SARS-CoV-2, NAA: NOT DETECTED

## 2019-10-23 ENCOUNTER — Encounter: Payer: Self-pay | Admitting: Family Medicine

## 2019-10-23 ENCOUNTER — Ambulatory Visit (INDEPENDENT_AMBULATORY_CARE_PROVIDER_SITE_OTHER): Payer: Self-pay | Admitting: Family Medicine

## 2019-10-23 ENCOUNTER — Other Ambulatory Visit: Payer: Self-pay

## 2019-10-23 DIAGNOSIS — F321 Major depressive disorder, single episode, moderate: Secondary | ICD-10-CM

## 2019-10-23 DIAGNOSIS — F411 Generalized anxiety disorder: Secondary | ICD-10-CM

## 2019-10-23 DIAGNOSIS — I10 Essential (primary) hypertension: Secondary | ICD-10-CM

## 2019-10-23 MED ORDER — TRAMADOL HCL 50 MG PO TABS: ORAL_TABLET | ORAL | 0 refills | Status: DC

## 2019-10-23 MED ORDER — METFORMIN HCL ER 500 MG PO TB24: 500.0000 mg | ORAL_TABLET | Freq: Every day | ORAL | 5 refills | Status: DC

## 2019-10-23 NOTE — Progress Notes (Signed)
   Subjective:    Patient ID: Michelle Tate, female    DOB: 04-Nov-1995, 24 y.o.   MRN: [id number]  Headache  This is a new problem. Episode onset: 4 days. Episode frequency: goes away when she is lying down to go to sleep. Pain location: above left eye, Treatments tried: tylenol, excedrin. Improvement on treatment: helps briefly.   Pt having a heavy cycle and is concerned about fibroids.   Pt wants to get a refill on metformin. Has been out for about one month.   Virtual Visit via Telephone Note  I connected with Michelle Tate on 10/23/19 at 10:00 AM EST by telephone and verified that I am speaking with the correct person using two identifiers.  Location: Patient: home Provider: office   I discussed the limitations, risks, security and privacy concerns of performing an evaluation and management service by telephone and the availability of in person appointments. I also discussed with the patient that there may be a patient responsible charge related to this service. The patient expressed understanding and agreed to proceed.   History of Present Illness:    Observations/Objective:   Assessment and Plan:   Follow Up Instructions:    I discussed the assessment and treatment plan with the patient. The patient was provided an opportunity to ask questions and all were answered. The patient agreed with the plan and demonstrated an understanding of the instructions.   The patient was advised to call back or seek an in-person evaluation if the symptoms worsen or if the condition fails to improve as anticipated.  I provided 20 minutes of non-face-to-face time during this encounter.  Four days ago  Pretty rough at times   Usually stress assoc h a   Using excedrin max     Review of Systems  Neurological: Positive for headaches.       Objective:   Physical Exam  Virtual     Assessment & Plan:  Impression 1 mixed tension vascular headache.  Not responding to any  over-the-counter medication.  Discussed.  Will give as needed tramadol to add to ibuprofen or Tylenol  2.  Type 2 diabetes.  Patient has run out of her Metformin.  Will refill rationale discussed  Diet exercise discussed follow-up as scheduled

## 2019-11-14 ENCOUNTER — Encounter: Payer: Self-pay | Admitting: Family Medicine

## 2019-11-15 ENCOUNTER — Encounter: Payer: Self-pay | Admitting: Family Medicine

## 2019-12-25 ENCOUNTER — Ambulatory Visit: Payer: 59 | Attending: Internal Medicine

## 2019-12-25 ENCOUNTER — Other Ambulatory Visit: Payer: Self-pay

## 2019-12-25 DIAGNOSIS — Z20822 Contact with and (suspected) exposure to covid-19: Secondary | ICD-10-CM | POA: Insufficient documentation

## 2019-12-26 LAB — NOVEL CORONAVIRUS, NAA: SARS-CoV-2, NAA: NOT DETECTED

## 2020-01-22 ENCOUNTER — Ambulatory Visit: Payer: 59 | Attending: Internal Medicine

## 2020-01-22 ENCOUNTER — Other Ambulatory Visit: Payer: Self-pay

## 2020-01-22 DIAGNOSIS — Z20822 Contact with and (suspected) exposure to covid-19: Secondary | ICD-10-CM | POA: Insufficient documentation

## 2020-01-23 LAB — SARS-COV-2, NAA 2 DAY TAT

## 2020-01-23 LAB — NOVEL CORONAVIRUS, NAA: SARS-CoV-2, NAA: NOT DETECTED

## 2020-04-02 ENCOUNTER — Ambulatory Visit: Payer: Self-pay | Admitting: Family Medicine

## 2020-04-03 ENCOUNTER — Ambulatory Visit: Payer: Self-pay | Admitting: Family Medicine

## 2020-04-07 ENCOUNTER — Ambulatory Visit: Payer: Self-pay | Admitting: Family Medicine

## 2020-04-10 ENCOUNTER — Other Ambulatory Visit: Payer: Self-pay

## 2020-04-10 ENCOUNTER — Encounter: Payer: Self-pay | Admitting: Family Medicine

## 2020-04-10 ENCOUNTER — Ambulatory Visit (INDEPENDENT_AMBULATORY_CARE_PROVIDER_SITE_OTHER): Payer: 59 | Admitting: Family Medicine

## 2020-04-10 VITALS — BP 124/82 | HR 93 | Temp 97.2°F | Ht 64.0 in | Wt 360.8 lb

## 2020-04-10 DIAGNOSIS — Z91018 Allergy to other foods: Secondary | ICD-10-CM

## 2020-04-10 DIAGNOSIS — Z3200 Encounter for pregnancy test, result unknown: Secondary | ICD-10-CM | POA: Diagnosis not present

## 2020-04-10 DIAGNOSIS — L301 Dyshidrosis [pompholyx]: Secondary | ICD-10-CM | POA: Diagnosis not present

## 2020-04-10 DIAGNOSIS — F321 Major depressive disorder, single episode, moderate: Secondary | ICD-10-CM

## 2020-04-10 DIAGNOSIS — E119 Type 2 diabetes mellitus without complications: Secondary | ICD-10-CM | POA: Diagnosis not present

## 2020-04-10 LAB — POCT URINE PREGNANCY: Preg Test, Ur: NEGATIVE

## 2020-04-10 MED ORDER — TRIAMCINOLONE ACETONIDE 0.1 % EX CREA: 1.0000 "application " | TOPICAL_CREAM | Freq: Two times a day (BID) | CUTANEOUS | 0 refills | Status: DC

## 2020-04-10 MED ORDER — BUPROPION HCL ER (SR) 150 MG PO TB12: ORAL_TABLET | ORAL | 5 refills | Status: DC
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
## 2020-04-15 LAB — LIPID PANEL
Chol/HDL Ratio: 3.7 ratio (ref 0.0–4.4)
Cholesterol, Total: 155 mg/dL (ref 100–199)
HDL: 42 mg/dL (ref >39)
LDL Chol Calc (NIH): 91 mg/dL (ref 0–99)
Triglycerides: 123 mg/dL (ref 0–149)
VLDL Cholesterol Cal: 22 mg/dL (ref 5–40)

## 2020-04-15 LAB — TSH: TSH: 2.56 u[IU]/mL (ref 0.450–4.500)

## 2020-04-15 LAB — CMP14+EGFR
ALT: 10 IU/L (ref 0–32)
AST: 11 IU/L (ref 0–40)
Albumin/Globulin Ratio: 1.3 (ref 1.2–2.2)
Albumin: 3.9 g/dL (ref 3.9–5.0)
Alkaline Phosphatase: 107 IU/L (ref 48–121)
BUN/Creatinine Ratio: 13 (ref 9–23)
BUN: 10 mg/dL (ref 6–20)
Bilirubin Total: <0.2 mg/dL (ref 0.0–1.2)
CO2: 25 mmol/L (ref 20–29)
Calcium: 9.8 mg/dL (ref 8.7–10.2)
Chloride: 98 mmol/L (ref 96–106)
Creatinine, Ser: 0.8 mg/dL (ref 0.57–1.00)
GFR calc Af Amer: 118 mL/min/{1.73_m2} (ref >59)
GFR calc non Af Amer: 102 mL/min/{1.73_m2} (ref >59)
Globulin, Total: 3.1 g/dL (ref 1.5–4.5)
Glucose: 153 mg/dL — ABNORMAL HIGH (ref 65–99)
Potassium: 4.7 mmol/L (ref 3.5–5.2)
Sodium: 138 mmol/L (ref 134–144)
Total Protein: 7 g/dL (ref 6.0–8.5)

## 2020-04-15 LAB — CBC
Hematocrit: 40.8 % (ref 34.0–46.6)
Hemoglobin: 13 g/dL (ref 11.1–15.9)
MCH: 25.3 pg — ABNORMAL LOW (ref 26.6–33.0)
MCHC: 31.9 g/dL (ref 31.5–35.7)
MCV: 80 fL (ref 79–97)
Platelets: 416 10*3/uL (ref 150–450)
RBC: 5.13 x10E6/uL (ref 3.77–5.28)
RDW: 13.1 % (ref 11.7–15.4)
WBC: 9.3 10*3/uL (ref 3.4–10.8)

## 2020-04-15 LAB — HEMOGLOBIN A1C
Est. average glucose Bld gHb Est-mCnc: 163 mg/dL
Hgb A1c MFr Bld: 7.3 % — ABNORMAL HIGH (ref 4.8–5.6)

## 2020-04-16 ENCOUNTER — Telehealth: Payer: Self-pay | Admitting: Family Medicine

## 2020-04-16 MED ORDER — METFORMIN HCL ER 500 MG PO TB24: 500.0000 mg | ORAL_TABLET | Freq: Every day | ORAL | 5 refills | Status: DC

## 2020-04-16 NOTE — Telephone Encounter (Signed)
Patient notified of results.

## 2020-05-16 ENCOUNTER — Other Ambulatory Visit: Payer: Self-pay

## 2020-05-16 ENCOUNTER — Ambulatory Visit (INDEPENDENT_AMBULATORY_CARE_PROVIDER_SITE_OTHER): Payer: 59 | Admitting: Allergy & Immunology

## 2020-05-16 ENCOUNTER — Encounter: Payer: Self-pay | Admitting: Allergy & Immunology

## 2020-05-16 VITALS — BP 156/100 | HR 100 | Temp 97.4°F | Resp 18 | Ht 67.0 in | Wt 353.8 lb

## 2020-05-16 DIAGNOSIS — J4520 Mild intermittent asthma, uncomplicated: Secondary | ICD-10-CM | POA: Diagnosis not present

## 2020-05-16 DIAGNOSIS — L2089 Other atopic dermatitis: Secondary | ICD-10-CM | POA: Diagnosis not present

## 2020-05-16 DIAGNOSIS — J3089 Other allergic rhinitis: Secondary | ICD-10-CM | POA: Diagnosis not present

## 2020-05-16 DIAGNOSIS — J302 Other seasonal allergic rhinitis: Secondary | ICD-10-CM

## 2020-05-16 DIAGNOSIS — Z91018 Allergy to other foods: Secondary | ICD-10-CM

## 2020-05-16 MED ORDER — EPINEPHRINE 0.3 MG/0.3ML IJ SOAJ: 0.3000 mg | Freq: Once | INTRAMUSCULAR | 1 refills | Status: AC | PRN

## 2020-05-16 MED ORDER — FLUTICASONE PROPIONATE 50 MCG/ACT NA SUSP: 2.0000 | Freq: Every day | NASAL | 5 refills | Status: DC

## 2020-05-16 NOTE — Progress Notes (Signed)
NEW PATIENT  Date of Service/Encounter:  05/16/20  Referring provider: Annalee Genta, DO   Assessment:   Food allergy  Seasonal and perennial allergic rhinitis (grasses, ragweed, weeds, trees, indoor molds, outdoor molds, dust mites, cat, dog and cockroach)  Flexural atopic dermatitis  Mild intermittent asthma, uncomplicated   COVID19 vaccine hesitant  Plan/Recommendations:   1. Food intolerance - Testing was positive to peanuts and tree nuts. - Testing was negative to everything else. - We are going to get blood work to confirm the seafood and the banana.  - These can take 12 weeks to return. - Anaphylaxis management plan provided. - EpiPen training reviewed.  2. Seasonal and perennial allergic rhinitis - Testing today showed: grasses, ragweed, weeds, trees, indoor molds, outdoor molds, dust mites, cat, dog and cockroach - Copy of test results provided.  - Avoidance measures provided. - Continue with: Zyrtec (cetirizine) 10mg  tablet once daily - Start taking: Flonase (fluticasone) two sprays per nostril daily - You can use an extra dose of the antihistamine, if needed, for breakthrough symptoms.  - Consider nasal saline rinses 1-2 times daily to remove allergens from the nasal cavities as well as help with mucous clearance (this is especially helpful to do before the nasal sprays are given) - Consider allergy shots as a means of long-term control. - Allergy shots "re-train" and "reset" the immune system to ignore environmental allergens and decrease the resulting immune response to those allergens (sneezing, itchy watery eyes, runny nose, nasal congestion, etc).    - Allergy shots improve symptoms in 75-85% of patients.  - We can discuss more at the next appointment if the medications are not working for you.  3. Intermittent asthma, uncomplicated - Lung testing looked great. - We are not going to make any medication changes. - Continue with albuterol 2 puffs  every 4-6 hours as needed. - There is no need for controller medication at this time.  4. Return in about 6 weeks (around 06/27/2020). This can be an in-person, a virtual Webex or a telephone follow up visit.   Subjective:   Michelle Tate is a 24 y.o. female presenting today for evaluation of  Chief Complaint  Patient presents with  . Food Intolerance    shellfish, peanuts, bananas, vanilla  . Allergic Rhinitis   . Wheezing    28 has a history of the following: Patient Active Problem List   Diagnosis Date Noted  . Genital herpes 10/20/2018  . Generalized anxiety disorder 10/16/2018  . Depression, major, single episode, moderate (HCC) 10/16/2018  . Essential hypertension 01/30/2018  . Biliary colic   . Adjustment disorder with anxious mood 09/14/2016  . Adjustment disorder with mixed anxiety and depressed mood 08/17/2016  . Vitamin D deficiency 03/26/2016  . Vulvar irritation 03/31/2015  . DUB (dysfunctional uterine bleeding) 02/22/2014  . Irregular bleeding 02/27/2013  . Morbid obesity (HCC) 02/27/2013  . Asthma 02/26/2013  . 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  04/22/2013 was referred by [name], DO.     Michelle Tate is a 24 y.o. female presenting for an evaluation of multiple atopic complaints   Asthma/Respiratory Symptom History: She was diagnosed with asthma when she was around 71-78 years of age. She has albuterol to use as needed . She estimates that she has not used it in a while. Her last use was two weeks ago when she was working out.  She is trying to lose weight. She was in the hospital for her symptoms at one point but this was when she was less than 9 years of age. She has not been on prednisone from what I can gather in years.   Allergic Rhinitis Symptom History: She does endorse itchy watery eyes and runny nose. She typically has issues in the morning. She  currently uses her allergy tablets OTC medications. She does not use a nose spray. She has never been tested for allergies ever. She grew up in [place].   Food Allergy Symptom History: She knows that she is allergic to bananas. She last ate one when she was 45 or 24 years old. She had throat closure with this and eye puffiness. She also throat closure and went to the hospital for this. In the ED, she received injectab le medications to help with the breathing. She has never tried anything with bananas since that. She does have some problems with citrus. She has never had watermelon be cause she "knows" she is allergic to it since her mother is as well. She does have a history of peanut allergy. She has been allergic "since [she] was little". She has throat closure with this and even when she is exposed to it. She has the same reaction to tree nuts. She does drink Lactaid milk. She eats wheat without a problem. She eats eggs as well as soy. She has never had a problem with sesame seed to her knowledge.  She does not like salmon but has never reacted to it. She did have some lobster tail and she started itching. She does not eat fish sticks. She does not like tuna. She does NOT have an EpiPen.  Eczema Symptom History: Her main complaint is itching on her hands, arms and face. This seems to be triggered when she is exposed to shrimp and strawberries. This started around five months now. She thought that this was unrelated to anything, but then last week she decided to get this looked at more thoroughly. The rash is very itchy. She did start with OTC medications without improvement. She also was prescribed triamcinolone which did help with the rash. She did have it in the absence of shrimp and strawberries as well, so it is difficult to pin it down. She did have eczema as a child.   She is hesitant about the COVID19 vaccine. She has never had a problem with a vaccine in the past. She does get somewhat sick with  the flu shots, but she has gotten it several years in a row without a problem. She has tolerated Miralax as a child.  Otherwise, there is no history of other atopic diseases, including drug allergies, stinging insect allergies, urticaria or contact dermatitis. There is no significant infectious history. Vaccinations are up to date.    Past Medical History: Patient Active Problem List   Diagnosis Date Noted  . Genital herpes 10/20/2018  . Generalized anxiety disorder 10/16/2018  . Depression, major, single episode, moderate (HCC) 10/16/2018  . Essential hypertension 01/30/2018  . Biliary colic   . Adjustment disorder with anxious mood 09/14/2016  . Adjustment disorder with mixed anxiety and depressed mood 08/17/2016  . Vitamin D deficiency 03/26/2016  . Vulvar irritation 03/31/2015  . DUB (dysfunctional uterine bleeding) 02/22/2014  . Irregular bleeding 02/27/2013  . Morbid obesity (HCC) 02/27/2013  . Asthma 02/26/2013  . 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    Medication List:  Allergies as of  05/16/2020      Reactions   Banana Anaphylaxis, Swelling   Peanut-containing Drug Products Anaphylaxis, Swelling   Penicillins Anaphylaxis, Itching, Swelling   Has patient had a PCN reaction causing immediate rash, facial/tongue/throat swelling, SOB or lightheadedness with hypotension: No Has patient had a PCN reaction causing severe rash involving mucus membranes orNo skin necrosis: No Has patient had a PCN reaction that required hospitalization: No Has patient had a PCN reaction occurring within the last 10 years: No If all of the above answers are "NO", then may proceed with Cephalosporin use.   Sulfa Drugs Cross Reactors Anaphylaxis, Itching, Swelling   Vanilla Other (See Comments)   Confirmed on Allergy Test      Medication List       Accurate as of May 16, 2020  2:35 PM. If you have any questions, ask your nurse or doctor.        STOP taking these  medications   Lidocaine 5 % Crea Stopped by: Alfonse Spruce, MD     TAKE these medications   acetaminophen 325 MG tablet Commonly known as: TYLENOL Take 325 mg by mouth every 6 (six) hours as needed.   albuterol 108 (90 Base) MCG/ACT inhaler Commonly known as: VENTOLIN HFA Inhale 2 puffs into the lungs every 4 (four) hours as needed for wheezing or shortness of breath.   buPROPion 150 MG 12 hr tablet Commonly known as: Wellbutrin SR One tablet daily for 3 days then one tablet BID   EPINEPHrine 0.3 mg/0.3 mL Soaj injection Commonly known as: EPI-PEN Inject 0.3 mLs (0.3 mg total) into the muscle once as needed for anaphylaxis. Started by: Alfonse Spruce, MD   fluticasone 50 MCG/ACT nasal spray Commonly known as: FLONASE Place 2 sprays into both nostrils daily. Started by: Alfonse Spruce, MD   ibuprofen 800 MG tablet Commonly known as: ADVIL Take 1 tablet (800 mg total) by mouth 3 (three) times daily. What changed:   when to take this  reasons to take this   metFORMIN 500 MG 24 hr tablet Commonly known as: Glucophage XR Take 1 tablet (500 mg total) by mouth daily after supper.   traMADol 50 MG tablet Commonly known as: ULTRAM Take one tablet every 6 hours prn headache   triamcinolone cream 0.1 % Commonly known as: KENALOG Apply 1 application topically 2 (two) times daily.   valACYclovir 1000 MG tablet Commonly known as: VALTREX Take 1 tablet (1,000 mg total) by mouth 2 (two) times daily. What changed: Another medication with the same name was removed. Continue taking this medication, and follow the directions you see here. Changed by: Alfonse Spruce, MD       Birth History: non-contributory  Developmental History: non-contributory  Past Surgical History: Past Surgical History:  Procedure Laterality Date  . GALLBLADDER SURGERY  01/16/2018  . implanon placement    . TONSILLECTOMY    . WISDOM TOOTH EXTRACTION       Family  History: Family History  Problem Relation Age of Onset  . Diabetes Paternal Grandmother   . Hypertension Paternal Grandmother   . Hypertension Maternal Grandmother   . Heart disease Father   . Hypertension Father   . Diabetes Father   . Seizures Mother   . Stroke Other   . Cancer Other   . Thyroid disease Cousin      Social History: Elleen lives at home with her family. 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  She  is not a smoker at all. She is not    Review of Systems  Constitutional: Negative.  Negative for chills, fever, malaise/fatigue and weight loss.  HENT: Negative.  Negative for congestion, ear discharge, ear pain, sinus pain and sore throat.   Eyes: Negative for pain, discharge and redness.  Respiratory: Negative for cough, sputum production, shortness of breath, wheezing and stridor.   Cardiovascular: Negative.  Negative for chest pain and palpitations.  Gastrointestinal: Negative for abdominal pain, constipation, diarrhea, heartburn, nausea and vomiting.  Skin: Positive for itching and rash.  Neurological: Negative for dizziness and headaches.  Endo/Heme/Allergies: Positive for environmental allergies. Does not bruise/bleed easily.       Objective:   Blood pressure (!) 156/100, pulse 100, temperature (!) 97.4 F (36.3 C), temperature source Temporal, resp. rate 18, height 5\' 7"  (1.702 m), weight (!) 353 lb 12.8 oz (160.5 kg), SpO2 99 %. Body mass index is 55.41 kg/m.   Physical Exam:   Physical Exam Constitutional:      Appearance: She is well-developed. She is obese.  HENT:     Head: Normocephalic and atraumatic.     Right Ear: Tympanic membrane, ear canal and external ear normal. No drainage, swelling or tenderness. Tympanic membrane is not injected, scarred, erythematous, retracted or bulging.     Left Ear: Tympanic membrane, ear canal and external ear normal. No drainage, swelling or tenderness. Tympanic membrane is not  injected, scarred, erythematous, retracted or bulging.     Nose: No nasal deformity, septal deviation, mucosal edema or rhinorrhea.     Right Turbinates: Enlarged, swollen and pale.     Left Turbinates: Enlarged, swollen and pale.     Right Sinus: No maxillary sinus tenderness or frontal sinus tenderness.     Left Sinus: No maxillary sinus tenderness or frontal sinus tenderness.     Comments: Turbinates enlarged bilaterally.     Mouth/Throat:     Mouth: Mucous membranes are not pale and not dry.     Pharynx: Uvula midline.     Comments: Cobblestoning present in the posterior oropharynx.  Eyes:     General:        Right eye: No discharge.        Left eye: No discharge.     Conjunctiva/sclera: Conjunctivae normal.     Right eye: Right conjunctiva is not injected. No chemosis.    Left eye: Left conjunctiva is not injected. No chemosis.    Pupils: Pupils are equal, round, and reactive to light.  Cardiovascular:     Rate and Rhythm: Normal rate and regular rhythm.     Heart sounds: Normal heart sounds.  Pulmonary:     Effort: Pulmonary effort is normal. No tachypnea, accessory muscle usage or respiratory distress.     Breath sounds: Normal breath sounds. No wheezing, rhonchi or rales.     Comments: Moving air well in all lung fields.  Chest:     Chest wall: No tenderness.  Abdominal:     Tenderness: There is no abdominal tenderness. There is no guarding or rebound.  Lymphadenopathy:     Head:     Right side of head: No submandibular, tonsillar or occipital adenopathy.     Left side of head: No submandibular, tonsillar or occipital adenopathy.     Cervical: No cervical adenopathy.  Skin:    Coloration: Skin is not pale.     Findings: No abrasion, erythema, petechiae or rash. Rash is not papular, urticarial or vesicular.  Comments: No eczematous or urticarial lesions.   Neurological:     Mental Status: She is alert.      Diagnostic studies:    Spirometry: results normal  (FEV1: 2.74/89%, FVC: 3.32/92%, FEV1/FVC: 83%).    Spirometry consistent with normal pattern.   Allergy Studies:     Airborne Adult Perc - 05/16/20 0900    Time Antigen Placed 0454    Allergen Manufacturer Waynette Buttery    Location Back    Number of Test 59    1. Control-Buffer 50% Glycerol Negative    2. Control-Histamine 1 mg/ml 2+    3. Albumin saline Negative    4. Bahia 2+    5. [place] 2+    6. Johnson Negative    7. Kentucky Blue 3+    8. Meadow Fescue 3+    9. Perennial Rye 3+    10. Sweet Vernal 3+    11. Timothy 4+    12. Cocklebur 2+    13. Burweed Marshelder Negative    14. Ragweed, short Negative    15. Ragweed, Giant 2+    16. Plantain,  English Negative    17. Lamb's Quarters Negative    18. Sheep Sorrell Negative    19. Rough Pigweed 2+    20. Marsh Elder, Rough 2+    21. Mugwort, Common 3+    22. Ash mix 3+    23. Birch mix Negative    24. Beech American Negative    25. Box, Elder 2+    26. Cedar, red Negative    27. Cottonwood, Eastern 2+    28. Elm mix Negative    29. Hickory 4+    30. Maple mix 4+    31. Oak, Guinea[place] mix 4+    32. Pecan Pollen 4+    33. Pine mix Negative    34. Sycamore Eastern Negative    35. Walnut, Black Pollen 3+    36. Alternaria alternata 3+    37. Cladosporium Herbarum Negative    38. Aspergillus mix 2+    39. Penicillium mix Negative    40. Bipolaris sorokiniana (Helminthosporium) 2+    41. Drechslera spicifera (Curvularia) Negative    42. Mucor plumbeus Negative    43. Fusarium moniliforme Negative    44. Aureobasidium pullulans (pullulara) 2+    45. Rhizopus oryzae Negative    46. Botrytis cinera Negative    47. Epicoccum nigrum Negative    48. Phoma betae 2+    49. Candida Albicans Negative    50. Trichophyton mentagrophytes Negative    51. Mite, D Farinae  5,000 AU/ml Negative    52. Mite, D Pteronyssinus  5,000 AU/ml Negative    53. Cat Hair 10,000 BAU/ml 4+    54.  Dog Epithelia Negative    55. Mixed Feathers  Negative    56. Horse Epithelia Negative    57. Cockroach, German 3+    58. Mouse Negative    59. Tobacco Leaf Negative          Intradermal - 05/16/20 1000    Time Antigen Placed 1000    Allergen Manufacturer Greer    Location Arm    Number of Test 3    Control Negative    Dog 3+    Mite mix 3+          Food Adult Perc - 05/16/20 0900    Time Antigen Placed 0981    Allergen Manufacturer Waynette Buttery    Location Back  Number of allergen test 32    1. Peanut --   18x20   8. Shellfish Mix Negative    9. Fish Mix Negative    10. Cashew Negative    11. Pecan Food --   10x13   12. Walnut Food --   8x10   13. Almond Negative    14. Hazelnut --   5x7   15. [place] nut --   3x5   16. Coconut Negative    17. Pistachio Negative    18. Catfish Negative    19. Bass Negative    20. Trout Negative    21. Tuna Negative    22. Salmon Negative    23. Flounder Negative    24. Codfish Negative    25. Shrimp Negative    26. Crab Negative    27. Lobster Negative    28. Oyster Negative    29. Scallops Negative    31. Oat  Negative    42. Tomato Negative    47. Mushrooms Negative    48. Avocado Negative    60. Strawberry Negative    61. Cantaloupe Negative    62. Watermelon Negative    63. Pineapple --   3x6   68. Nutmeg Negative           Allergy testing results were read and interpreted by myself, documented by clinical staff.         Malachi BondsJoel Bela Nyborg, MD [facility]

## 2020-05-16 NOTE — Patient Instructions (Addendum)
1. Food intolerance - Testing was positive to peanuts and tree nuts. - Testing was negative to everything else. - We are going to get blood work to confirm the seafood and the banana.  - These can take 12 weeks to return. - Anaphylaxis management plan provided. - EpiPen training reviewed.  2. Seasonal and perennial allergic rhinitis - Testing today showed: grasses, ragweed, weeds, trees, indoor molds, outdoor molds, dust mites, cat, dog and cockroach - Copy of test results provided.  - Avoidance measures provided. - Continue with: Zyrtec (cetirizine) 10mg  tablet once daily - Start taking: Flonase (fluticasone) two sprays per nostril daily - You can use an extra dose of the antihistamine, if needed, for breakthrough symptoms.  - Consider nasal saline rinses 1-2 times daily to remove allergens from the nasal cavities as well as help with mucous clearance (this is especially helpful to do before the nasal sprays are given) - Consider allergy shots as a means of long-term control. - Allergy shots "re-train" and "reset" the immune system to ignore environmental allergens and decrease the resulting immune response to those allergens (sneezing, itchy watery eyes, runny nose, nasal congestion, etc).    - Allergy shots improve symptoms in 75-85% of patients.  - We can discuss more at the next appointment if the medications are not working for you.  3. Intermittent asthma, uncomplicated - Lung testing looked great. - We are not going to make any medication changes. - Continue with albuterol 2 puffs every 4-6 hours as needed. - There is no need for controller medication at this time.  4. Return in about 6 weeks (around 06/27/2020). This can be an in-person, a virtual Webex or a telephone follow up visit.   Please inform 08/27/2020 of any Emergency Department visits, hospitalizations, or changes in symptoms. Call us before going to the ED for breathing or allergy symptoms since we might be able to fit you  in for a sick visit. Feel free to contact us anytime with any questions, problems, or concerns.  It was a pleasure to meet you today!  Websites that have reliable patient information: 1. American Academy of Asthma, Allergy, and Immunology: www.aaaai.org 2. Food Allergy Research and Education (FARE): foodallergy.org 3. Mothers of Asthmatics: http://www.asthmacommunitynetwork.org 4. American College of Allergy, Asthma, and Immunology: www.acaai.org   COVID-19 Vaccine Information can be found at: [place] For questions related to vaccine distribution or appointments, please email [email or web address] or call [phone].     "Like" [phone] on Facebook and Instagram for our latest updates!        Make sure you are registered to vote! If you have moved or changed any of your contact information, you will need to get this updated before voting!  In some cases, you MAY be able to register to vote online: [place]    Reducing Pollen Exposure  The American Academy of Allergy, Asthma and Immunology suggests the following steps to reduce your exposure to pollen during allergy seasons.    1. Do not hang sheets or clothing out to dry; pollen may collect on these items. 2. Do not mow lawns or spend time around freshly cut grass; mowing stirs up pollen. 3. Keep windows closed at night.  Keep car windows closed while driving. 4. Minimize morning activities outdoors, a time when pollen counts are usually at their highest. 5. Stay indoors as much as possible when pollen counts or humidity is high and on windy days when pollen tends to remain in the air longer. 6. Use air  conditioning when possible.  Many air conditioners have filters that trap the pollen spores. 7. Use a HEPA room air filter to remove pollen form the indoor air you breathe.  Control of Mold Allergen   Mold and fungi can  grow on a variety of surfaces provided certain temperature and moisture conditions exist.  Outdoor molds grow on plants, decaying vegetation and soil.  The major outdoor mold, Alternaria and Cladosporium, are found in very high numbers during hot and dry conditions.  Generally, a late Summer - Fall peak is seen for common outdoor fungal spores.  Rain will temporarily lower outdoor mold spore count, but counts rise rapidly when the rainy period ends.  The most important indoor molds are Aspergillus and Penicillium.  Dark, humid and poorly ventilated basements are ideal sites for mold growth.  The next most common sites of mold growth are the bathroom and the kitchen.  Outdoor (Seasonal) Mold Control   1. Use air conditioning and keep windows closed 2. Avoid exposure to decaying vegetation. 3. Avoid leaf raking. 4. Avoid grain handling. 5. Consider wearing a face mask if working in moldy areas.    Indoor (Perennial) Mold Control     1. Maintain humidity below 50%. 2. Clean washable surfaces with 5% bleach solution. 3. Remove sources e.g. contaminated carpets.     Control of Dust Mite Allergen    Dust mites play a major role in allergic asthma and rhinitis.  They occur in environments with high humidity wherever human skin is found.  Dust mites absorb humidity from the atmosphere (ie, they do not drink) and feed on organic matter (including shed human and animal skin).  Dust mites are a microscopic type of insect that you cannot see with the naked eye.  High levels of dust mites have been detected from mattresses, pillows, carpets, upholstered furniture, bed covers, clothes, soft toys and any woven material.  The principal allergen of the dust mite is found in its feces.  A gram of dust may contain 1,000 mites and 250,000 fecal particles.  Mite antigen is easily measured in the air during house cleaning activities.  Dust mites do not bite and do not cause harm to humans, other than by  triggering allergies/asthma.    Ways to decrease your exposure to dust mites in your home:  1. Encase mattresses, box springs and pillows with a mite-impermeable barrier or cover   2. Wash sheets, blankets and drapes weekly in hot water (130 F) with detergent and dry them in a dryer on the hot setting.  3. Have the room cleaned frequently with a vacuum cleaner and a damp dust-mop.  For carpeting or rugs, vacuuming with a vacuum cleaner equipped with a high-efficiency particulate air (HEPA) filter.  The dust mite allergic individual should not be in a room which is being cleaned and should wait 1 hour after cleaning before going into the room. 4. Do not sleep on upholstered furniture (eg, couches).   5. If possible removing carpeting, upholstered furniture and drapery from the home is ideal.  Horizontal blinds should be eliminated in the rooms where the person spends the most time (bedroom, study, television room).  Washable vinyl, roller-type shades are optimal. 6. Remove all non-washable stuffed toys from the bedroom.  Wash stuffed toys weekly like sheets and blankets above.   7. Reduce indoor humidity to less than 50%.  Inexpensive humidity monitors can be purchased at most hardware stores.  Do not use a humidifier as can make  the problem worse and are not recommended.   Control of Dog or Cat Allergen  Avoidance is the best way to manage a dog or cat allergy. If you have a dog or cat and are allergic to dog or cats, consider removing the dog or cat from the home. If you have a dog or cat but don't want to find it a new home, or if your family wants a pet even though someone in the household is allergic, here are some strategies that may help keep symptoms at bay:  1. Keep the pet out of your bedroom and restrict it to only a few rooms. Be advised that keeping the dog or cat in only one room will not limit the allergens to that room. 2. Don't pet, hug or kiss the dog or cat; if you do, wash your  hands with soap and water. 3. High-efficiency particulate air (HEPA) cleaners run continuously in a bedroom or living room can reduce allergen levels over time. 4. Regular use of a high-efficiency vacuum cleaner or a central vacuum can reduce allergen levels. 5. Giving your dog or cat a bath at least once a week can reduce airborne allergen.  Control of Cockroach Allergen  Cockroach allergen has been identified as an important cause of acute attacks of asthma, especially in urban settings.  There are fifty-five species of cockroach that exist in the [place], however only three, the [place], [place] species produce allergen that can affect patients with Asthma.  Allergens can be obtained from fecal particles, egg casings and secretions from cockroaches.    1. Remove food sources. 2. Reduce access to water. 3. Seal access and entry points. 4. Spray runways with 0.5-1% Diazinon or Chlorpyrifos 5. Blow boric acid power under stoves and refrigerator. 6. Place bait stations (hydramethylnon) at feeding sites.

## 2020-05-21 NOTE — Addendum Note (Signed)
Addended by: Robet Leu A on: 05/21/2020 05:04 PM   Modules accepted: Orders

## 2020-06-12 ENCOUNTER — Emergency Department (HOSPITAL_COMMUNITY)
Admission: EM | Admit: 2020-06-12 | Discharge: 2020-06-12 | Disposition: A | Discharge status: Home / Self Care | Payer: 59 | Attending: Emergency Medicine | Admitting: Emergency Medicine

## 2020-06-12 ENCOUNTER — Encounter (HOSPITAL_COMMUNITY): Payer: Self-pay

## 2020-06-12 ENCOUNTER — Telehealth: Payer: Self-pay | Admitting: Family Medicine

## 2020-06-12 ENCOUNTER — Other Ambulatory Visit: Payer: Self-pay

## 2020-06-12 DIAGNOSIS — J4520 Mild intermittent asthma, uncomplicated: Secondary | ICD-10-CM | POA: Diagnosis not present

## 2020-06-12 DIAGNOSIS — Z7951 Long term (current) use of inhaled steroids: Secondary | ICD-10-CM | POA: Diagnosis not present

## 2020-06-12 DIAGNOSIS — Z79899 Other long term (current) drug therapy: Secondary | ICD-10-CM | POA: Diagnosis not present

## 2020-06-12 DIAGNOSIS — R0602 Shortness of breath: Secondary | ICD-10-CM

## 2020-06-12 DIAGNOSIS — Z7984 Long term (current) use of oral hypoglycemic drugs: Secondary | ICD-10-CM | POA: Insufficient documentation

## 2020-06-12 DIAGNOSIS — R059 Cough, unspecified: Secondary | ICD-10-CM

## 2020-06-12 DIAGNOSIS — Z87891 Personal history of nicotine dependence: Secondary | ICD-10-CM | POA: Diagnosis not present

## 2020-06-12 DIAGNOSIS — I10 Essential (primary) hypertension: Secondary | ICD-10-CM | POA: Diagnosis not present

## 2020-06-12 DIAGNOSIS — U071 COVID-19: Secondary | ICD-10-CM | POA: Insufficient documentation

## 2020-06-12 DIAGNOSIS — E1140 Type 2 diabetes mellitus with diabetic neuropathy, unspecified: Secondary | ICD-10-CM | POA: Insufficient documentation

## 2020-06-12 MED ORDER — ALBUTEROL SULFATE HFA 108 (90 BASE) MCG/ACT IN AERS: 2.0000 | INHALATION_SPRAY | RESPIRATORY_TRACT | 2 refills | Status: AC | PRN

## 2020-06-12 MED ORDER — AEROCHAMBER MINI CHAMBER DEVI: 1.0000 [IU] | Freq: Every day | 0 refills | Status: AC
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
## 2020-06-12 NOTE — Discharge Instructions (Addendum)
We obtain Covid testing today for you.  Please check your MyChart for results later.  Please also follow-up with your primary care provider if you feel like her asthma is worsening and you are using her albuterol more often.  I have also sent a chamber to the pharmacy so that you can use it with your inhaler to help consolidate the medication.

## 2020-06-12 NOTE — Telephone Encounter (Signed)
Pt was in ER today and the ER doctor told her Dr. Ladona Ridgel would have to refill inhaler.   albuterol (PROVENTIL HFA;VENTOLIN HFA) 108 (90 Base) MCG/ACT inhaler    [facility] [place]

## 2020-06-12 NOTE — ED Triage Notes (Signed)
Pt states last night she had an asthma attack and got SOB and had to use her inhaler 3 times. Her mom wanted her to come to the ED this morning to get checked out. Pt in NAD in triage. Pt has not had to use inhaler since yesterday and states SOB better since yesterday.

## 2020-06-12 NOTE — ED Notes (Signed)
History of asthma.  Last took puff last night.

## 2020-06-13 LAB — SARS CORONAVIRUS 2 (TAT 6-24 HRS): SARS Coronavirus 2: POSITIVE — AB

## 2020-06-14 ENCOUNTER — Telehealth: Payer: Self-pay | Admitting: Nurse Practitioner

## 2020-06-14 NOTE — Telephone Encounter (Signed)
Called to discuss with [name] about Covid symptoms and the use of casirivimab/imdevimab, a combination monoclonal antibody infusion for those with mild to moderate Covid symptoms and at a high risk of hospitalization.     Pt is qualified for this infusion at the [facility] infusion center due to co-morbid conditions (as indicated below)   Unable to reach. Voicemail left and Mychart sent.    Patient Active Problem List   Diagnosis Date Noted  . Genital herpes 10/20/2018  . Generalized anxiety disorder 10/16/2018  . Depression, major, single episode, moderate (HCC) 10/16/2018  . Essential hypertension 01/30/2018  . Biliary colic   . Adjustment disorder with anxious mood 09/14/2016  . Adjustment disorder with mixed anxiety and depressed mood 08/17/2016  . Vitamin D deficiency 03/26/2016  . Vulvar irritation 03/31/2015  . DUB (dysfunctional uterine bleeding) 02/22/2014  . Irregular bleeding 02/27/2013  . Morbid obesity (HCC) 02/27/2013  . Asthma 02/26/2013  . 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    Willette Alma, AGPCNP-BC

## 2020-07-02 ENCOUNTER — Ambulatory Visit (INDEPENDENT_AMBULATORY_CARE_PROVIDER_SITE_OTHER): Payer: 59 | Admitting: Allergy & Immunology

## 2020-07-02 ENCOUNTER — Other Ambulatory Visit: Payer: Self-pay

## 2020-07-02 ENCOUNTER — Encounter: Payer: Self-pay | Admitting: Allergy & Immunology

## 2020-07-02 VITALS — BP 138/90 | HR 88 | Temp 98.7°F | Resp 18

## 2020-07-02 DIAGNOSIS — J4520 Mild intermittent asthma, uncomplicated: Secondary | ICD-10-CM

## 2020-07-02 DIAGNOSIS — Z91018 Allergy to other foods: Secondary | ICD-10-CM | POA: Diagnosis not present

## 2020-07-02 DIAGNOSIS — J3089 Other allergic rhinitis: Secondary | ICD-10-CM

## 2020-07-02 DIAGNOSIS — J302 Other seasonal allergic rhinitis: Secondary | ICD-10-CM | POA: Diagnosis not present

## 2020-07-02 NOTE — Progress Notes (Signed)
FOLLOW UP  Date of Service/Encounter:  07/02/20   Assessment:   Food allergy (shellfish, peanut, tree nuts, bananas)  Seasonal and perennial allergic rhinitis (grasses, ragweed, weeds, trees, indoor molds, outdoor molds, dust mites, cat, dog and cockroach)  Flexural atopic dermatitis  Mild intermittent asthma, uncomplicated   COVID19 vaccine hesitant - willing to get it in our office  Plan/Recommendations:   1. Food intolerance - Avoid shellfish, peanut, treenuts, bananas. In case of an allergic reaction, give Benadryl teaspoonfuls every 4 hours, and if life-threatening symptoms occur, inject with EpiPen 0.3 mg. Get lab work completed from last visit. We will call you with lab results once they are back  2. Seasonal and perennial allergic rhinitis - Testing on 05/16/20 showed: grasses, ragweed, weeds, trees, indoor molds, outdoor molds, dust mites, cat, dog and cockroach - Avoidance measures provided. - Continue with: Zyrtec (cetirizine) 10mg  tablet once daily - Start taking: Flonase (fluticasone) two sprays per nostril daily - You can use an extra dose of the antihistamine, if needed, for breakthrough symptoms.  - Consider nasal saline rinses 1-2 times daily to remove allergens from the nasal cavities as well as help with mucous clearance (this is especially helpful to do before the nasal sprays are given) - Consider allergy shots as a means of long-term control. - Allergy shots "re-train" and "reset" the immune system to ignore environmental allergens and decrease the resulting immune response to those allergens (sneezing, itchy watery eyes, runny nose, nasal congestion, etc).    - Allergy shots improve symptoms in 75-85% of patients.  - We can discuss more at the next appointment if the medications are not working for you.  3. Intermittent asthma, uncomplicated - Lung testing looked great. - We are not going to make any medication changes. - Continue with albuterol 2  puffs every 4-6 hours as needed. - There is no need for controller medication at this time.  4. Return in about 3 months (around 10/01/2020). This can be an in-person, a virtual Webex or a telephone follow up visit.  Subjective:   Michelle Tate is a 24 y.o. female presenting today for follow up of  Chief Complaint  Patient presents with  . Food Intolerance    doing well. npo issues, she did get covid last month but doing much better and no symptoms.     28 has a history of the following: Patient Active Problem List   Diagnosis Date Noted  . Genital herpes 10/20/2018  . Generalized anxiety disorder 10/16/2018  . Depression, major, single episode, moderate (HCC) 10/16/2018  . Essential hypertension 01/30/2018  . Biliary colic   . Adjustment disorder with anxious mood 09/14/2016  . Adjustment disorder with mixed anxiety and depressed mood 08/17/2016  . Vitamin D deficiency 03/26/2016  . Vulvar irritation 03/31/2015  . DUB (dysfunctional uterine bleeding) 02/22/2014  . Irregular bleeding 02/27/2013  . Morbid obesity (HCC) 02/27/2013  . Asthma 02/26/2013  . 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  Michelle Tate is a 24 y.o. female presenting for a follow up visit.  Asthma/Respiratory Symptom History: She reports that on June 12, 2020 she went to the ER due to dliffuclty breathing. She was diagnosed with COVID-19. She had difficulty breathing for 3-4 days then her breathing returned to baseline. Today she denies any cough, wheeze, tightness in chest, shortness of breath and nocturnal awakenings. Since her last office visit she has not required  any steroids. She has not used her albuterol inhaler in the past couple weeks.  Allergic Rhinitis Symptom History: She reports occasional clear rhinorrhea and denies any nasal congestion, post nasal drip and itchy watery eyes. She is currently using Zyrtec 10  mg once a day and has not used her Flonase lately  Food Allergy Symptom History: She continues to avoid shellfish, peanuts, tree nuts and bananas. Since her last office visit she has not had her lab work completed that was ordered at her last office visit, but is interested in getting this completed. She has not had any accidental ingestion or use of her Epipen   She is interested in getting her first COVID-19 vaccine injection in our [place] office is available. She denies any problems with previous vaccines. She has never had Miralax.  Otherwise, there have been no changes to her past medical history, surgical history, family history, or social history.    Review of Systems  Constitutional: Negative for chills and fever.  HENT: Negative for congestion.        Denies rhinorrhea and post nasal drip  Eyes:       Denies itchy watery eyes  Respiratory: Negative for cough, shortness of breath and wheezing.   Cardiovascular: Negative for chest pain and palpitations.  Gastrointestinal: Negative for abdominal pain and heartburn.  Genitourinary: Negative for dysuria.  Skin: Negative for itching and rash.  Neurological: Negative for headaches.  Endo/Heme/Allergies: Positive for environmental allergies.       Objective:   Blood pressure 138/90, pulse 88, temperature 98.7 F (37.1 C), temperature source Temporal, resp. rate 18, last menstrual period 06/12/2020, SpO2 99 %. There is no height or weight on file to calculate BMI.   Physical Exam:  Physical Exam Constitutional:      Appearance: Normal appearance.  HENT:     Head: Normocephalic and atraumatic.     Comments: Pharynx normal. Eyes normal. Ears normal. Nose normal    Right Ear: Tympanic membrane, ear canal and external ear normal.     Left Ear: Tympanic membrane, ear canal and external ear normal.     Nose: Nose normal.     Mouth/Throat:     Mouth: Mucous membranes are moist.     Pharynx: Oropharynx is clear.  Eyes:      Conjunctiva/sclera: Conjunctivae normal.  Cardiovascular:     Rate and Rhythm: Regular rhythm.     Heart sounds: Normal heart sounds.  Pulmonary:     Effort: Pulmonary effort is normal.     Breath sounds: Normal breath sounds.     Comments: Lungs clear to auscultation Musculoskeletal:     Cervical back: Neck supple.  Skin:    General: Skin is warm.  Neurological:     Mental Status: She is alert and oriented to person, place, and time.  Psychiatric:        Mood and Affect: Mood normal.        Behavior: Behavior normal.        Thought Content: Thought content normal.        Judgment: Judgment normal.      Diagnostic studies: none  Spirometry: FVC 3.37 L, FEV1 2.76L.  Predicted FVC 3.59 L, FEV1 3.08 L.  Spirometry indicates normal ventilatory function.  Allergy Studies: none    Thank you for the opportunity to care for this patient.  Please do not hesitate to call us with any questions.  Nehemiah Settle, FNP [facility]  I performed a history and physical examination of the patient and discussed her management with the Nurse Practitioner. I reviewed the Nurse Practitioner's note and agree with the documented findings and plan of care. The note in its entirety was edited by myself, including the physical exam, assessment, and plan.    Malachi Bonds, MD  [facility]

## 2020-07-02 NOTE — Patient Instructions (Addendum)
1. Food intolerance - Avoid shellfish, peanut, treenuts, bananas. In case of an allergic reaction, give Benadryl teaspoonfuls every 4 hours, and if life-threatening symptoms occur, inject with EpiPen 0.3 mg. Get lab work completed from last visit. We will call you with lab results once they are back  2. Seasonal and perennial allergic rhinitis - Testing on 05/16/20 showed: grasses, ragweed, weeds, trees, indoor molds, outdoor molds, dust mites, cat, dog and cockroach - Avoidance measures provided. - Continue with: Zyrtec (cetirizine) 10mg  tablet once daily - Start taking: Flonase (fluticasone) two sprays per nostril daily - You can use an extra dose of the antihistamine, if needed, for breakthrough symptoms.  - Consider nasal saline rinses 1-2 times daily to remove allergens from the nasal cavities as well as help with mucous clearance (this is especially helpful to do before the nasal sprays are given) - Consider allergy shots as a means of long-term control. - Allergy shots "re-train" and "reset" the immune system to ignore environmental allergens and decrease the resulting immune response to those allergens (sneezing, itchy watery eyes, runny nose, nasal congestion, etc).    - Allergy shots improve symptoms in 75-85% of patients.  - We can discuss more at the next appointment if the medications are not working for you.  3. Intermittent asthma, uncomplicated - Lung testing looked great. - We are not going to make any medication changes. - Continue with albuterol 2 puffs every 4-6 hours as needed. - There is no need for controller medication at this time.  4. Return in about 3 months (around 10/01/2020). This can be an in-person, a virtual Webex or a telephone follow up visit.   Please inform 10/03/2020 of any Emergency Department visits, hospitalizations, or changes in symptoms. Call us before going to the ED for breathing or allergy symptoms since we might be able to fit you in for a sick visit.  Feel free to contact us anytime with any questions, problems, or concerns.  It was a pleasure to meet you today!  Websites that have reliable patient information: 1. American Academy of Asthma, Allergy, and Immunology: www.aaaai.org 2. Food Allergy Research and Education (FARE): foodallergy.org 3. Mothers of Asthmatics: http://www.asthmacommunitynetwork.org 4. American College of Allergy, Asthma, and Immunology: www.acaai.org   COVID-19 Vaccine Information can be found at: [place] For questions related to vaccine distribution or appointments, please email [email or web address] or call [phone].     "Like" [phone] on Facebook and Instagram for our latest updates!        Make sure you are registered to vote! If you have moved or changed any of your contact information, you will need to get this updated before voting!  In some cases, you MAY be able to register to vote online: [place]    Reducing Pollen Exposure  The American Academy of Allergy, Asthma and Immunology suggests the following steps to reduce your exposure to pollen during allergy seasons.    1. Do not hang sheets or clothing out to dry; pollen may collect on these items. 2. Do not mow lawns or spend time around freshly cut grass; mowing stirs up pollen. 3. Keep windows closed at night.  Keep car windows closed while driving. 4. Minimize morning activities outdoors, a time when pollen counts are usually at their highest. 5. Stay indoors as much as possible when pollen counts or humidity is high and on windy days when pollen tends to remain in the air longer. 6. Use air conditioning when possible.  Many air conditioners have filters  that trap the pollen spores. 7. Use a HEPA room air filter to remove pollen form the indoor air you breathe.  Control of Mold Allergen   Mold and fungi can grow on a variety of  surfaces provided certain temperature and moisture conditions exist.  Outdoor molds grow on plants, decaying vegetation and soil.  The major outdoor mold, Alternaria and Cladosporium, are found in very high numbers during hot and dry conditions.  Generally, a late Summer - Fall peak is seen for common outdoor fungal spores.  Rain will temporarily lower outdoor mold spore count, but counts rise rapidly when the rainy period ends.  The most important indoor molds are Aspergillus and Penicillium.  Dark, humid and poorly ventilated basements are ideal sites for mold growth.  The next most common sites of mold growth are the bathroom and the kitchen.  Outdoor (Seasonal) Mold Control   1. Use air conditioning and keep windows closed 2. Avoid exposure to decaying vegetation. 3. Avoid leaf raking. 4. Avoid grain handling. 5. Consider wearing a face mask if working in moldy areas.    Indoor (Perennial) Mold Control     1. Maintain humidity below 50%. 2. Clean washable surfaces with 5% bleach solution. 3. Remove sources e.g. contaminated carpets.     Control of Dust Mite Allergen    Dust mites play a major role in allergic asthma and rhinitis.  They occur in environments with high humidity wherever human skin is found.  Dust mites absorb humidity from the atmosphere (ie, they do not drink) and feed on organic matter (including shed human and animal skin).  Dust mites are a microscopic type of insect that you cannot see with the naked eye.  High levels of dust mites have been detected from mattresses, pillows, carpets, upholstered furniture, bed covers, clothes, soft toys and any woven material.  The principal allergen of the dust mite is found in its feces.  A gram of dust may contain 1,000 mites and 250,000 fecal particles.  Mite antigen is easily measured in the air during house cleaning activities.  Dust mites do not bite and do not cause harm to humans, other than by triggering  allergies/asthma.    Ways to decrease your exposure to dust mites in your home:  1. Encase mattresses, box springs and pillows with a mite-impermeable barrier or cover   2. Wash sheets, blankets and drapes weekly in hot water (130 F) with detergent and dry them in a dryer on the hot setting.  3. Have the room cleaned frequently with a vacuum cleaner and a damp dust-mop.  For carpeting or rugs, vacuuming with a vacuum cleaner equipped with a high-efficiency particulate air (HEPA) filter.  The dust mite allergic individual should not be in a room which is being cleaned and should wait 1 hour after cleaning before going into the room. 4. Do not sleep on upholstered furniture (eg, couches).   5. If possible removing carpeting, upholstered furniture and drapery from the home is ideal.  Horizontal blinds should be eliminated in the rooms where the person spends the most time (bedroom, study, television room).  Washable vinyl, roller-type shades are optimal. 6. Remove all non-washable stuffed toys from the bedroom.  Wash stuffed toys weekly like sheets and blankets above.   7. Reduce indoor humidity to less than 50%.  Inexpensive humidity monitors can be purchased at most hardware stores.  Do not use a humidifier as can make the problem worse and are not recommended.  Control of Dog or Cat Allergen  Avoidance is the best way to manage a dog or cat allergy. If you have a dog or cat and are allergic to dog or cats, consider removing the dog or cat from the home. If you have a dog or cat but don't want to find it a new home, or if your family wants a pet even though someone in the household is allergic, here are some strategies that may help keep symptoms at bay:  1. Keep the pet out of your bedroom and restrict it to only a few rooms. Be advised that keeping the dog or cat in only one room will not limit the allergens to that room. 2. Don't pet, hug or kiss the dog or cat; if you do, wash your hands with  soap and water. 3. High-efficiency particulate air (HEPA) cleaners run continuously in a bedroom or living room can reduce allergen levels over time. 4. Regular use of a high-efficiency vacuum cleaner or a central vacuum can reduce allergen levels. 5. Giving your dog or cat a bath at least once a week can reduce airborne allergen.  Control of Cockroach Allergen  Cockroach allergen has been identified as an important cause of acute attacks of asthma, especially in urban settings.  There are fifty-five species of cockroach that exist in the [place], however only three, the [place], [place] species produce allergen that can affect patients with Asthma.  Allergens can be obtained from fecal particles, egg casings and secretions from cockroaches.    1. Remove food sources. 2. Reduce access to water. 3. Seal access and entry points. 4. Spray runways with 0.5-1% Diazinon or Chlorpyrifos 5. Blow boric acid power under stoves and refrigerator. 6. Place bait stations (hydramethylnon) at feeding sites.

## 2020-07-03 ENCOUNTER — Encounter: Payer: Self-pay | Admitting: Allergy & Immunology

## 2020-07-16 ENCOUNTER — Ambulatory Visit: Payer: 59

## 2020-10-01 ENCOUNTER — Ambulatory Visit: Payer: 59 | Admitting: Allergy & Immunology

## 2020-10-13 ENCOUNTER — Ambulatory Visit (INDEPENDENT_AMBULATORY_CARE_PROVIDER_SITE_OTHER): Payer: 59 | Admitting: Family Medicine

## 2020-10-13 DIAGNOSIS — Z5329 Procedure and treatment not carried out because of patient's decision for other reasons: Secondary | ICD-10-CM

## 2020-10-17 ENCOUNTER — Telehealth: Payer: Self-pay | Admitting: Family Medicine
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
## 2020-10-30 ENCOUNTER — Encounter: Payer: Self-pay | Admitting: Family Medicine

## 2020-10-31 ENCOUNTER — Other Ambulatory Visit: Payer: Self-pay

## 2020-10-31 ENCOUNTER — Encounter: Payer: Self-pay | Admitting: Medical

## 2020-10-31 ENCOUNTER — Ambulatory Visit (INDEPENDENT_AMBULATORY_CARE_PROVIDER_SITE_OTHER): Payer: 59 | Admitting: Medical

## 2020-10-31 VITALS — BP 156/100 | HR 95 | Ht 66.0 in | Wt 354.6 lb

## 2020-10-31 DIAGNOSIS — L905 Scar conditions and fibrosis of skin: Secondary | ICD-10-CM

## 2020-10-31 NOTE — Patient Instructions (Signed)
Skin Abscess  A skin abscess is an infected area of your skin that contains pus and other material. An abscess can happen in any part of your body. Some abscesses break open (rupture) on their own. Most continue to get worse unless they are treated. The infection can spread deeper into the body and into your blood, which can make you feel sick. A skin abscess is caused by germs that enter the skin through a cut or scrape. It can also be caused by blocked oil and sweat glands or infected hair follicles. This condition is usually treated by:  Draining the pus.  Taking antibiotic medicines.  Placing a warm, wet washcloth over the abscess. Follow these instructions at home: Medicines  Take over-the-counter and prescription medicines only as told by your doctor.  If you were prescribed an antibiotic medicine, take it as told by your doctor. Do not stop taking the antibiotic even if you start to feel better.   Abscess care  If you have an abscess that has not drained, place a warm, clean, wet washcloth over the abscess several times a day. Do this as told by your doctor.  Follow instructions from your doctor about how to take care of your abscess. Make sure you: ? Cover the abscess with a bandage (dressing). ? Change your bandage or gauze as told by your doctor. ? Wash your hands with soap and water before you change the bandage or gauze. If you cannot use soap and water, use hand sanitizer.  Check your abscess every day for signs that the infection is getting worse. Check for: ? More redness, swelling, or pain. ? More fluid or blood. ? Warmth. ? More pus or a bad smell.   General instructions  To avoid spreading the infection: ? Do not share personal care items, towels, or hot tubs with others. ? Avoid making skin-to-skin contact with other people.  Keep all follow-up visits as told by your doctor. This is important. Contact a doctor if:  You have more redness, swelling, or pain  around your abscess.  You have more fluid or blood coming from your abscess.  Your abscess feels warm when you touch it.  You have more pus or a bad smell coming from your abscess.  You have a fever.  Your muscles ache.  You have chills.  You feel sick. Get help right away if:  You have very bad (severe) pain.  You see red streaks on your skin spreading away from the abscess. Summary  A skin abscess is an infected area of your skin that contains pus and other material.  The abscess is caused by germs that enter the skin through a cut or scrape. It can also be caused by blocked oil and sweat glands or infected hair follicles.  Follow your doctor's instructions on caring for your abscess, taking medicines, preventing infections, and keeping follow-up visits. This information is not intended to replace advice given to you by your health care provider. Make sure you discuss any questions you have with your health care provider. Document Revised: 05/10/2019 Document Reviewed: 11/17/2017 Elsevier Patient Education  2021 Elsevier Inc. How to Take a [employer] A sitz bath is a warm water bath that may be used to care for your rectum, genital area, or the area between your rectum and genitals (perineum). In a sitz bath, the water only comes up to your hips and covers your buttocks. A sitz bath may be done in a bathtub or with  a portable sitz bath that fits over the toilet. Your health care provider may recommend a sitz bath to help:  Relieve pain and discomfort after delivering a baby.  Relieve pain and itching from hemorrhoids or anal fissures.  Relieve pain after certain surgeries.  Relax muscles that are sore or tight. How to take a sitz bath Take 3-4 sitz baths a day, or as many as told by your health care provider. Bathtub sitz bath To take a sitz bath in a bathtub: 1. Partially fill a bathtub with warm water. The water should be deep enough to cover your hips and buttocks  when you are sitting in the tub. 2. Follow your health care provider's instructions if you are told to put medicine in the water. 3. Sit in the water. Open the tub drain a little, and leave it open during your bath. 4. Turn on the warm water again, enough to replace the water that is draining out. Keep the water running throughout your bath. This helps keep the water at the right level and temperature. 5. Soak in the water for 15-20 minutes, or as long as told by your health care provider. 6. When you are done, be careful when you stand up. You may feel dizzy. 7. After the sitz bath, pat yourself dry. Do not rub your skin to dry it.   Over-the-toilet sitz bath To take a sitz bath with an over-the-toilet basin: 1. Follow the manufacturer's instructions. 2. Fill the basin with warm water. 3. Follow your health care provider's instructions if you were told to put medicine in the water. 4. Sit on the seat. Make sure the water covers your buttocks and perineum. 5. Soak in the water for 15-20 minutes, or as long as told by your health care provider. 6. After the sitz bath, pat yourself dry. Do not rub your skin to dry it. 7. Clean and dry the basin between uses. 8. Discard the basin if it cracks, or according to the manufacturer's instructions.   Contact a health care provider if:  Your pain or itching gets worse. Do not continue with sitz baths if your symptoms get worse.  You have new symptoms. Do not continue with sitz baths until you talk with your health care provider. Summary  A sitz bath is a warm water bath in which the water only comes up to your hips and covers your buttocks.  A sitz bath may help relieve pain and discomfort after delivering a baby. It also may help with pain and itching from hemorrhoids or anal fissures, or pain after certain surgeries. It can also help to relax muscles that are sore or tight.  Take 3-4 sitz baths a day, or as many as told by your health care  provider. Soak in the water for 15-20 minutes.  Do not continue with sitz baths if your symptoms get worse. This information is not intended to replace advice given to you by your health care provider. Make sure you discuss any questions you have with your health care provider. Document Revised: 06/19/2020 Document Reviewed: 06/19/2020 Elsevier Patient Education  2021 [employer].

## 2020-10-31 NOTE — Progress Notes (Signed)
  History:  [name] is a 25 y.o. G0P0000 who presents to clinic today for a bump on her vagina. She states that she frequently has recurrent boils in this area on her mons pubis. She noted about 1 month ago that the area began to grow some and become painful to touch. She states that the area usually swells and then drains spontaneously, but it has not this time. She denies redness, heat to touch or fever. She does occasionally shave the area.   The following portions of the patient's history were reviewed and updated as appropriate: allergies, current medications, family history, past medical history, social history, past surgical history and problem list.  Review of Systems:  Review of Systems  Constitutional: Negative for chills and fever.  Gastrointestinal: Negative for abdominal pain.  Genitourinary:       +  Bump on mons pubis with tenderness to touch      Objective:  Physical Exam BP (!) 156/100 (BP Location: Left Arm, Patient Position: Sitting, Cuff Size: Normal)   Pulse 95   Ht 5\' 6"  (1.676 m)   Wt (!) 354 lb 9.6 oz (160.8 kg)   LMP 10/31/2020   BMI 57.23 kg/m  Physical Exam Vitals reviewed. Exam conducted with a chaperone present.  Constitutional:      General: She is not in acute distress.    Appearance: Normal appearance. She is obese.  Cardiovascular:     Rate and Rhythm: Normal rate.  Pulmonary:     Effort: Pulmonary effort is normal.  Skin:    General: Skin is warm and dry.     Findings: No erythema.       Neurological:     Mental Status: She is alert and oriented to person, place, and time.     Assessment & Plan:  1. Scar tissue - Likely scar tissue from previous boils in this same area vs new onset of abscess in early stages - Warm compresses and sitz baths discussed  - Warning signs for infection and worsening condition discussed  - Patient to return to [facility]  Approximately 12 minutes of total time was spent with this patient on  history taking, physical exam and documentation  11/02/2020, PA-C 10/31/2020 10:48 AM

## 2020-11-06 ENCOUNTER — Encounter: Payer: Self-pay | Admitting: Family Medicine

## 2020-11-06 ENCOUNTER — Other Ambulatory Visit: Payer: Self-pay

## 2020-11-06 ENCOUNTER — Ambulatory Visit (INDEPENDENT_AMBULATORY_CARE_PROVIDER_SITE_OTHER): Payer: 59 | Admitting: Family Medicine

## 2020-11-06 VITALS — BP 128/84 | HR 111 | Temp 97.2°F | Ht 65.8 in | Wt 350.4 lb

## 2020-11-06 DIAGNOSIS — E1165 Type 2 diabetes mellitus with hyperglycemia: Secondary | ICD-10-CM | POA: Diagnosis not present

## 2020-11-06 DIAGNOSIS — IMO0002 Reserved for concepts with insufficient information to code with codable children: Secondary | ICD-10-CM

## 2020-11-06 DIAGNOSIS — E1140 Type 2 diabetes mellitus with diabetic neuropathy, unspecified: Secondary | ICD-10-CM

## 2020-11-06 DIAGNOSIS — Z7689 Persons encountering health services in other specified circumstances: Secondary | ICD-10-CM

## 2020-11-06 MED ORDER — OZEMPIC (0.25 OR 0.5 MG/DOSE) 2 MG/1.5ML ~~LOC~~ SOPN: PEN_INJECTOR | SUBCUTANEOUS | 2 refills | Status: DC
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
## 2020-12-04 ENCOUNTER — Encounter: Payer: Self-pay | Admitting: Family Medicine

## 2020-12-04 ENCOUNTER — Other Ambulatory Visit: Payer: Self-pay

## 2020-12-04 ENCOUNTER — Telehealth (INDEPENDENT_AMBULATORY_CARE_PROVIDER_SITE_OTHER): Payer: 59 | Admitting: Family Medicine

## 2020-12-04 DIAGNOSIS — Z713 Dietary counseling and surveillance: Secondary | ICD-10-CM
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
## 2020-12-06 NOTE — Progress Notes (Signed)
Pt was rescheduled since pt didn't get a weight check for the phone visit.

## 2020-12-11 ENCOUNTER — Encounter: Payer: Self-pay | Admitting: Adult Health

## 2020-12-18 ENCOUNTER — Encounter: Payer: Self-pay | Admitting: Family Medicine

## 2020-12-18 ENCOUNTER — Ambulatory Visit: Payer: 59 | Admitting: Family Medicine

## 2020-12-22 ENCOUNTER — Telehealth: Payer: Self-pay | Admitting: Family Medicine

## 2020-12-22 NOTE — Telephone Encounter (Signed)
Needs visit in office for weight check. Please schedule then route back

## 2020-12-22 NOTE — Telephone Encounter (Signed)
Patient is requesting refill on ozempic .25 to [facility]

## 2020-12-24 ENCOUNTER — Other Ambulatory Visit: Payer: 59 | Admitting: Adult Health

## 2021-01-09 NOTE — Telephone Encounter (Signed)
Called and left several messages to schedule appointment

## 2021-01-12 NOTE — Telephone Encounter (Signed)
Patient has missed two appointments 10/13/20 and 12/18/20.

## 2021-01-12 NOTE — Telephone Encounter (Signed)
Ok thanks, do we have a warning letter to send to her.  That if missing another visit will be dismissed?  Thanks,   Dr. Karie Schwalbe

## 2021-01-12 NOTE — Telephone Encounter (Signed)
How many no shows has this pt had?  Thx, Dr. Ladona Ridgel

## 2021-01-25 ENCOUNTER — Emergency Department (HOSPITAL_COMMUNITY)
Admission: EM | Admit: 2021-01-25 | Discharge: 2021-01-25 | Disposition: A | Discharge status: Home / Self Care | Payer: 59 | Attending: Emergency Medicine | Admitting: Emergency Medicine

## 2021-01-25 ENCOUNTER — Other Ambulatory Visit: Payer: Self-pay

## 2021-01-25 ENCOUNTER — Encounter (HOSPITAL_COMMUNITY): Payer: Self-pay

## 2021-01-25 DIAGNOSIS — I10 Essential (primary) hypertension: Secondary | ICD-10-CM | POA: Insufficient documentation

## 2021-01-25 DIAGNOSIS — E119 Type 2 diabetes mellitus without complications: Secondary | ICD-10-CM | POA: Diagnosis not present

## 2021-01-25 DIAGNOSIS — Y9301 Activity, walking, marching and hiking: Secondary | ICD-10-CM | POA: Insufficient documentation

## 2021-01-25 DIAGNOSIS — Y92512 Supermarket, store or market as the place of occurrence of the external cause: Secondary | ICD-10-CM | POA: Insufficient documentation

## 2021-01-25 DIAGNOSIS — Z87891 Personal history of nicotine dependence: Secondary | ICD-10-CM | POA: Diagnosis not present

## 2021-01-25 DIAGNOSIS — W228XXA Striking against or struck by other objects, initial encounter: Secondary | ICD-10-CM | POA: Diagnosis not present

## 2021-01-25 DIAGNOSIS — J45909 Unspecified asthma, uncomplicated: Secondary | ICD-10-CM | POA: Diagnosis not present

## 2021-01-25 DIAGNOSIS — S0591XA Unspecified injury of right eye and orbit, initial encounter: Secondary | ICD-10-CM | POA: Diagnosis present

## 2021-01-25 DIAGNOSIS — S0501XA Injury of conjunctiva and corneal abrasion without foreign body, right eye, initial encounter: Secondary | ICD-10-CM | POA: Diagnosis not present

## 2021-01-25 MED ORDER — FLUORESCEIN SODIUM 1 MG OP STRP
1.0000 | ORAL_STRIP | Freq: Once | OPHTHALMIC | Status: AC
  Administered 2021-01-25: 1 via OPHTHALMIC
  Filled 2021-01-25: qty 1

## 2021-01-25 MED ORDER — ERYTHROMYCIN 5 MG/GM OP OINT: 1.0000 "application " | TOPICAL_OINTMENT | Freq: Four times a day (QID) | OPHTHALMIC | 0 refills | Status: AC

## 2021-01-25 MED ORDER — TETRACAINE HCL 0.5 % OP SOLN
2.0000 [drp] | Freq: Once | OPHTHALMIC | Status: AC
  Administered 2021-01-25: 2 [drp] via OPHTHALMIC
  Filled 2021-01-25: qty 4
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
## 2021-01-25 NOTE — Discharge Instructions (Addendum)
You were evaluated in the Emergency Department and after careful evaluation, we did not find any emergent condition requiring admission or further testing in the hospital.  Your exam/testing today was overall reassuring.  Symptoms seem to be due to a corneal abrasion.  Please use the antibiotic ointment as directed.  Recommend follow-up with an ophthalmologist if not improving over the next few days.  Please return to the Emergency Department if you experience any worsening of your condition.  Thank you for allowing [place] to be a part of your care.

## 2021-01-25 NOTE — ED Triage Notes (Signed)
Pt c/o foreign body in right eye since yesterday. States she tried flushing her eye with no relief. Complains of blurry vision as well.

## 2021-02-09 ENCOUNTER — Other Ambulatory Visit: Payer: Self-pay

## 2021-02-09 ENCOUNTER — Ambulatory Visit (INDEPENDENT_AMBULATORY_CARE_PROVIDER_SITE_OTHER): Payer: 59 | Admitting: Family Medicine

## 2021-02-09 ENCOUNTER — Encounter: Payer: Self-pay | Admitting: Family Medicine

## 2021-02-09 VITALS — HR 98 | Temp 97.5°F | Ht 66.0 in | Wt 341.0 lb

## 2021-02-09 DIAGNOSIS — H6690 Otitis media, unspecified, unspecified ear: Secondary | ICD-10-CM | POA: Diagnosis not present

## 2021-02-09 DIAGNOSIS — J301 Allergic rhinitis due to pollen: Secondary | ICD-10-CM

## 2021-02-09 DIAGNOSIS — J029 Acute pharyngitis, unspecified: Secondary | ICD-10-CM

## 2021-02-09 DIAGNOSIS — J4530 Mild persistent asthma, uncomplicated: Secondary | ICD-10-CM

## 2021-02-09 DIAGNOSIS — B9689 Other specified bacterial agents as the cause of diseases classified elsewhere: Secondary | ICD-10-CM | POA: Insufficient documentation

## 2021-02-09 HISTORY — DX: Other specified bacterial agents as the cause of diseases classified elsewhere: B96.89

## 2021-02-09 LAB — POCT RAPID STREP A (OFFICE): Rapid Strep A Screen: NEGATIVE

## 2021-02-09 MED ORDER — DOXYCYCLINE HYCLATE 100 MG PO TABS: 100.0000 mg | ORAL_TABLET | Freq: Two times a day (BID) | ORAL | 0 refills | Status: DC

## 2021-02-09 MED ORDER — LORATADINE 10 MG PO TABS: 10.0000 mg | ORAL_TABLET | Freq: Every day | ORAL | 2 refills | Status: AC

## 2021-02-09 MED ORDER — PREDNISONE 20 MG PO TABS: 20.0000 mg | ORAL_TABLET | Freq: Two times a day (BID) | ORAL | 0 refills | Status: DC

## 2021-02-09 MED ORDER — FLUTICASONE PROPIONATE 50 MCG/ACT NA SUSP: 2.0000 | Freq: Every day | NASAL | 1 refills | Status: DC

## 2021-02-09 NOTE — Patient Instructions (Addendum)
Prednisone warning: Prednisone is an anti-inflammatory medication. It is best to only use it for short periods of time. It can cause  your blood pressure to increase and it can cause your blood sugar to increase. If you are a diabetic, please check your blood sugar twice per day while you are taking prednisone. If your sugars are > 200 or < 70 please seek medical attention right away.      Otitis Media, Adult  Otitis media is a condition in which the middle ear is red and swollen (inflamed) and full of fluid. The middle ear is the part of the ear that contains bones for hearing as well as air that helps send sounds to the brain. The condition usually goes away on its own. What are the causes? This condition is caused by a blockage in the eustachian tube. The eustachian tube connects the middle ear to the back of the nose. It normally allows air into the middle ear. The blockage is caused by fluid or swelling. Problems that can cause blockage include:  A cold or infection that affects the nose, mouth, or throat.  Allergies.  An irritant, such as tobacco smoke.  Adenoids that have become large. The adenoids are soft tissue located in the back of the throat, behind the nose and the roof of the mouth.  Growth or swelling in the upper part of the throat, just behind the nose (nasopharynx).  Damage to the ear caused by change in pressure. This is called barotrauma. What are the signs or symptoms? Symptoms of this condition include:  Ear pain.  Fever.  Problems with hearing.  Being tired.  Fluid leaking from the ear.  Ringing in the ear. How is this treated? This condition can go away on its own within 3-5 days. But if the condition is caused by bacteria or does not go away on its own, or if it keeps coming back, your doctor may:  Give you antibiotic medicines.  Give you medicines for pain. Follow these instructions at home:  Take over-the-counter and prescription medicines only  as told by your doctor.  If you were prescribed an antibiotic medicine, take it as told by your doctor. Do not stop taking the antibiotic even if you start to feel better.  Keep all follow-up visits as told by your doctor. This is important. Contact a doctor if:  You have bleeding from your nose.  There is a lump on your neck.  You are not feeling better in 5 days.  You feel worse instead of better. Get help right away if:  You have pain that is not helped with medicine.  You have swelling, redness, or pain around your ear.  You get a stiff neck.  You cannot move part of your face (paralysis).  You notice that the bone behind your ear hurts when you touch it.  You get a very bad headache. Summary  Otitis media means that the middle ear is red, swollen, and full of fluid.  This condition usually goes away on its own.  If the problem does not go away, treatment may be needed. You may be given medicines to treat the infection or to treat your pain.  If you were prescribed an antibiotic medicine, take it as told by your doctor. Do not stop taking the antibiotic even if you start to feel better.  Keep all follow-up visits as told by your doctor. This is important. This information is not intended to replace advice given  to you by your health care provider. Make sure you discuss any questions you have with your health care provider. Document Revised: 09/06/2019 Document Reviewed: 09/06/2019 Elsevier Patient Education  2021 [employer] Inc. Allergic Rhinitis, Adult Allergic rhinitis is a reaction to allergens. Allergens are things that can cause an allergic reaction. This condition affects the lining inside the nose (mucous membrane). There are two types of allergic rhinitis:  Seasonal. This type is also called hay fever. It happens only during some times of the year.  Perennial. This type can happen at any time of the year. This condition cannot be spread from person to person  (is not contagious). It can be mild, worse, or very bad. It can develop at any age and may be outgrown. What are the causes? This condition may be caused by:  Pollen from grasses, trees, and weeds.  Dust mites.  Smoke.  Mold.  Car fumes.  The pee (urine), spit, or dander of pets. Dander is dead skin cells from a pet.   What increases the risk? You are more likely to develop this condition if:  You have allergies in your family.  You have problems like allergies in your family. You may have: ? Swelling of parts of your eyes and eyelids. ? Asthma. This affects how you breathe. ? Long-term redness and swelling on your skin. ? Food allergies. What are the signs or symptoms? The main symptom of this condition is a runny or stuffy nose (nasal congestion). Other symptoms may include:  Sneezing or coughing.  Itching and tearing of your eyes.  Mucus that drips down the back of your throat (postnasal drip).  Trouble sleeping.  Feeling tired.  Headache.  Sore throat. How is this treated? There is no cure for this condition. You should avoid things that you are allergic to. Treatment can help to relieve symptoms. This may include:  Medicines that block allergy symptoms, such as corticosteroids or antihistamines. These may be given as a shot, nasal spray, or pill.  Avoiding things you are allergic to.  Medicines that give you bits of what you are allergic to over time. This is called immunotherapy. It is done if other treatments do not help. You may get: ? Shots. ? Medicine under your tongue.  Stronger medicines, if other treatments do not help. Follow these instructions at home: Avoiding allergens Find out what things you are allergic to and avoid them. To do this, try these things:  If you get allergies any time of year: ? Replace carpet with wood, tile, or vinyl flooring. Carpet can trap pet dander and dust. ? Do not smoke. Do not allow smoking in your home. ? Change  your heating and air conditioning filters at least once a month.  If you get allergies only some times of the year: ? Keep windows closed when you can. ? Plan things to do outside when pollen counts are lowest. Check pollen counts before you plan things to do outside. ? When you come indoors, change your clothes and shower before you sit on furniture or bedding.   If you are allergic to a pet: ? Keep the pet out of your bedroom. ? Vacuum, sweep, and dust often.   General instructions  Take over-the-counter and prescription medicines only as told by your doctor.  Drink enough fluid to keep your pee (urine) pale yellow.  Keep all follow-up visits as told by your doctor. This is important. Where to find more information  American Academy of Allergy,  Asthma & Immunology: www.aaaai.org Contact a doctor if:  You have a fever.  You get a cough that does not go away.  You make whistling sounds when you breathe (wheeze).  Your symptoms slow you down.  Your symptoms stop you from doing your normal things each day. Get help right away if:  You are short of breath. This symptom may be an emergency. Do not wait to see if the symptom will go away. Get medical help right away. Call your local emergency services (911 in the [place] Do not drive yourself to the hospital. Summary  Allergic rhinitis may be treated by taking medicines and avoiding things you are allergic to.  If you have allergies only some of the year, keep windows closed when you can at those times.  Contact your doctor if you get a fever or a cough that does not go away. This information is not intended to replace advice given to you by your health care provider. Make sure you discuss any questions you have with your health care provider. Document Revised: 11/26/2019 Document Reviewed: 10/02/2019 Elsevier Patient Education  2021 Else[employer] Inc. How to Perform a Sinus Rinse A sinus rinse is a home treatment. It rinses your  sinuses with a mixture of salt and water (saline solution). Sinuses are air-filled spaces in your skull behind the bones of your face and forehead. They open into your nasal cavity. A sinus rinse can help to clear your nasal cavity. It can clear mucus, dirt, dust, or pollen. You may do a sinus rinse when you have:  A cold.  A virus.  Allergies.  A sinus infection.  A stuffy nose. Talk with your doctor about whether a sinus rinse might help you. What are the risks? A sinus rinse is normally very safe and helpful. However, there are a few risks. These include:  A burning feeling in the sinuses. This may happen if you do not make the saline solution as instructed. Be sure to follow all directions when making the saline solution.  Nasal irritation.  Infection from unclean water. This is rare, but possible. Do not do a sinus rinse if you have had:  Ear or nasal surgery.  An ear infection.  Blocked ears. Supplies needed:  Saline solution or powder.  Distilled or germ-free (sterile) water may be needed to mix with saline powder. ? You may use boiled and cooled tap water. Boil tap water for 5 minutes; cool until it is lukewarm. Use within 24 hours. ? Do not use regular tap water to mix with the saline solution.  Neti pot or nasal rinse bottle. This releases the saline solution into your nose and through your sinuses. You can buy neti pots and rinse bottles: ? At your local pharmacy. ? At a health food store. ? Online. How to perform a sinus rinse 1. Wash your hands with soap and water. 2. Wash your device using the directions that came with it. 3. Dry your device. 4. Use the solution that comes with your device or one that is sold separately in stores. Follow the mixing directions on the package if you need to mix with sterile or distilled water. 5. Fill your device with the amount of saline solution stated in the device instructions. 6. Stand over a sink and tilt your head  sideways over the sink. 7. Place the spout of the device in your upper nostril (the one closer to the ceiling). 8. Gently pour or squeeze the saline solution into  your nasal cavity. The liquid should drain to your lower nostril if you are not too stuffed up (congested). 9. While rinsing, breathe through your open mouth. 10. Gently blow your nose to clear any mucus and rinse solution. Blowing too hard may cause ear pain. 11. Repeat in your other nostril. 12. Clean and rinse your device with clean water. 13. Air-dry your device. Talk with your doctor or pharmacist if you have questions about how to do a sinus rinse.   Summary  A sinus rinse is a home treatment. It rinses your sinuses with a mixture of salt and water (saline solution).  A sinus rinse is normally very safe and helpful. Follow all instructions carefully.  Talk with your doctor about whether a sinus rinse might help you. This information is not intended to replace advice given to you by your health care provider. Make sure you discuss any questions you have with your health care provider. Document Revised: 07/15/2020 Document Reviewed: 07/15/2020 Elsevier Patient Education  2021 [employer]
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
## 2021-02-10 LAB — STREP A DNA PROBE: Strep Gp A Direct, DNA Probe: NEGATIVE

## 2021-02-12 ENCOUNTER — Other Ambulatory Visit: Payer: 59 | Admitting: Adult Health

## 2021-03-17 ENCOUNTER — Encounter (HOSPITAL_COMMUNITY): Payer: Self-pay | Admitting: Emergency Medicine

## 2021-03-17 ENCOUNTER — Other Ambulatory Visit: Payer: Self-pay

## 2021-03-17 ENCOUNTER — Emergency Department (HOSPITAL_COMMUNITY): Payer: 59

## 2021-03-17 ENCOUNTER — Emergency Department (HOSPITAL_COMMUNITY)
Admission: EM | Admit: 2021-03-17 | Discharge: 2021-03-17 | Disposition: A | Discharge status: Home / Self Care | Payer: 59 | Attending: Emergency Medicine | Admitting: Emergency Medicine

## 2021-03-17 DIAGNOSIS — Z91010 Allergy to peanuts: Secondary | ICD-10-CM | POA: Diagnosis not present

## 2021-03-17 DIAGNOSIS — Z87891 Personal history of nicotine dependence: Secondary | ICD-10-CM | POA: Diagnosis not present

## 2021-03-17 DIAGNOSIS — Z7952 Long term (current) use of systemic steroids: Secondary | ICD-10-CM | POA: Insufficient documentation

## 2021-03-17 DIAGNOSIS — J4520 Mild intermittent asthma, uncomplicated: Secondary | ICD-10-CM | POA: Insufficient documentation

## 2021-03-17 DIAGNOSIS — I10 Essential (primary) hypertension: Secondary | ICD-10-CM | POA: Diagnosis not present

## 2021-03-17 DIAGNOSIS — Z7984 Long term (current) use of oral hypoglycemic drugs: Secondary | ICD-10-CM | POA: Insufficient documentation

## 2021-03-17 DIAGNOSIS — R109 Unspecified abdominal pain: Secondary | ICD-10-CM | POA: Diagnosis present

## 2021-03-17 DIAGNOSIS — E119 Type 2 diabetes mellitus without complications: Secondary | ICD-10-CM | POA: Diagnosis not present

## 2021-03-17 LAB — CBC WITH DIFFERENTIAL/PLATELET
Abs Immature Granulocytes: 0.02 10*3/uL (ref 0.00–0.07)
Basophils Absolute: 0 10*3/uL (ref 0.0–0.1)
Basophils Relative: 0 %
Eosinophils Absolute: 0.1 10*3/uL (ref 0.0–0.5)
Eosinophils Relative: 1 %
HCT: 41.2 % (ref 36.0–46.0)
Hemoglobin: 13 g/dL (ref 12.0–15.0)
Immature Granulocytes: 0 %
Lymphocytes Relative: 27 %
Lymphs Abs: 1.9 10*3/uL (ref 0.7–4.0)
MCH: 27.1 pg (ref 26.0–34.0)
MCHC: 31.6 g/dL (ref 30.0–36.0)
MCV: 85.8 fL (ref 80.0–100.0)
Monocytes Absolute: 0.6 10*3/uL (ref 0.1–1.0)
Monocytes Relative: 9 %
Neutro Abs: 4.4 10*3/uL (ref 1.7–7.7)
Neutrophils Relative %: 63 %
Platelets: 347 10*3/uL (ref 150–400)
RBC: 4.8 MIL/uL (ref 3.87–5.11)
RDW: 14.3 % (ref 11.5–15.5)
WBC: 7 10*3/uL (ref 4.0–10.5)
nRBC: 0 % (ref 0.0–0.2)

## 2021-03-17 LAB — COMPREHENSIVE METABOLIC PANEL
ALT: 15 U/L (ref 0–44)
AST: 16 U/L (ref 15–41)
Albumin: 3.7 g/dL (ref 3.5–5.0)
Alkaline Phosphatase: 81 U/L (ref 38–126)
Anion gap: 7 (ref 5–15)
BUN: 11 mg/dL (ref 6–20)
CO2: 29 mmol/L (ref 22–32)
Calcium: 8.9 mg/dL (ref 8.9–10.3)
Chloride: 98 mmol/L (ref 98–111)
Creatinine, Ser: 0.65 mg/dL (ref 0.44–1.00)
GFR, Estimated: >60 mL/min (ref >60)
Glucose, Bld: 111 mg/dL — ABNORMAL HIGH (ref 70–99)
Potassium: 4.2 mmol/L (ref 3.5–5.1)
Sodium: 134 mmol/L — ABNORMAL LOW (ref 135–145)
Total Bilirubin: 0.5 mg/dL (ref 0.3–1.2)
Total Protein: 7.6 g/dL (ref 6.5–8.1)

## 2021-03-17 LAB — URINALYSIS, ROUTINE W REFLEX MICROSCOPIC
Bilirubin Urine: NEGATIVE
Glucose, UA: NEGATIVE mg/dL
Hgb urine dipstick: NEGATIVE
Ketones, ur: NEGATIVE mg/dL
Nitrite: NEGATIVE
Protein, ur: NEGATIVE mg/dL
Specific Gravity, Urine: 1.018 (ref 1.005–1.030)
pH: 6 (ref 5.0–8.0)

## 2021-03-17 LAB — LIPASE, BLOOD: Lipase: 29 U/L (ref 11–51)

## 2021-03-17 LAB — POC URINE PREG, ED: Preg Test, Ur: NEGATIVE

## 2021-03-17 MED ORDER — LIDOCAINE 5 % EX PTCH
1.0000 | MEDICATED_PATCH | CUTANEOUS | Status: DC
  Administered 2021-03-17: 1 via TRANSDERMAL
  Filled 2021-03-17: qty 1

## 2021-03-17 MED ORDER — NAPROXEN 500 MG PO TABS: 500.0000 mg | ORAL_TABLET | Freq: Two times a day (BID) | ORAL | 0 refills | Status: DC

## 2021-03-17 MED ORDER — MORPHINE SULFATE (PF) 4 MG/ML IV SOLN
4.0000 mg | Freq: Once | INTRAVENOUS | Status: AC
Start: 2021-03-17 — End: 2021-03-17
  Administered 2021-03-17: 4 mg via INTRAVENOUS
  Filled 2021-03-17: qty 1

## 2021-03-17 NOTE — Discharge Instructions (Addendum)
It was a pleasure taking care of your today. As discussed, all of your labs were reassuring today. No signs of infection. Normal kidney function. Your CT was negative for any kidney stones or other abnormalities. Please follow-up with PCP if symptoms do not improve over the next few days. I am sending you home with pain medication.  Take as prescribed.  Do not mix with other over-the-counter pain medications.  You may also purchase over-the-counter Voltaren gel or Lidoderm patches for added pain relief.  Return to the ER for new or worsening symptoms.
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
## 2021-03-17 NOTE — ED Notes (Signed)
Patient transported to CT 

## 2021-03-17 NOTE — ED Triage Notes (Signed)
C/o LLQ abdominal since yesterday.  Rates pain 9/10.

## 2021-04-13 ENCOUNTER — Other Ambulatory Visit (HOSPITAL_COMMUNITY)
Admission: RE | Admit: 2021-04-13 | Discharge: 2021-04-13 | Disposition: A | Discharge status: Home / Self Care | Payer: 59 | Source: Ambulatory Visit | Attending: Adult Health | Admitting: Adult Health

## 2021-04-13 ENCOUNTER — Other Ambulatory Visit: Payer: Self-pay

## 2021-04-13 ENCOUNTER — Encounter: Payer: Self-pay | Admitting: Adult Health

## 2021-04-13 ENCOUNTER — Ambulatory Visit (INDEPENDENT_AMBULATORY_CARE_PROVIDER_SITE_OTHER): Payer: 59 | Admitting: Adult Health

## 2021-04-13 VITALS — BP 184/116 | HR 87 | Ht 66.0 in | Wt 347.0 lb

## 2021-04-13 DIAGNOSIS — I10 Essential (primary) hypertension: Secondary | ICD-10-CM

## 2021-04-13 DIAGNOSIS — Z01419 Encounter for gynecological examination (general) (routine) without abnormal findings: Secondary | ICD-10-CM

## 2021-04-13 HISTORY — DX: Encounter for gynecological examination (general) (routine) without abnormal findings: Z01.419

## 2021-04-13 MED ORDER — AMLODIPINE BESYLATE 5 MG PO TABS: 5.0000 mg | ORAL_TABLET | Freq: Every day | ORAL | 6 refills | Status: DC

## 2021-04-13 NOTE — Progress Notes (Signed)
Patient ID: [name], female   DOB: [birth year], 25 y.o.   MRN: [id number] History of Present Illness: Michelle Tate is a 25 year old black female,single, G0P0, in for well woman gyn exam and pap. PCP is Dr Michelle Tate.   Current Medications, Allergies, Past Medical History, Past Surgical History, Family History and Social History were reviewed in [employer] record.     Review of Systems:  Patient denies any headaches, hearing loss, fatigue, blurred vision, shortness of breath, chest pain, abdominal pain, problems with bowel movements, urination, or intercourse. No joint pain or mood swings.  Periods regular   Physical Exam:BP (!) 184/116 (BP Location: Right Arm, Cuff Size: Large)   Pulse 87   Ht 5\' 6"  (1.676 m)   Wt (!) 347 lb (157.4 kg)   LMP 04/07/2021   BMI 56.01 kg/m   BP was 146/103 on arrival. General:  Well developed, well nourished, no acute distress Skin:  Warm and dry Neck:  Midline trachea, normal thyroid, good ROM, no lymphadenopathy Lungs; Clear to auscultation bilaterally Breast:  No dominant palpable mass, retraction, or nipple discharge Cardiovascular: Regular rate and rhythm Abdomen:  Soft, non tender, no hepatosplenomegaly Pelvic:  External genitalia is normal in appearance, no lesions.  The vagina is normal in appearance. Urethra has no lesions or masses. The cervix is smooth, pap with GC/CHL and HR HPV genotyping performed.  Uterus is felt to be normal size, shape, and contour.  No adnexal masses or tenderness noted.Bladder is non tender, no masses felt. Extremities/musculoskeletal:  No swelling or varicosities noted, no clubbing or cyanosis Psych:  No mood changes, alert and cooperative,seems happy AA is 4 Fall risk is low Depression screen [facility] 2/9 04/13/2021 11/06/2020 04/10/2020  Decreased Interest 1 2 1   Down, Depressed, Hopeless 2 2 3   PHQ - 2 Score 3 4 4   Altered sleeping 0 3 2  Tired, decreased energy 2 0 3  Change in appetite 2 2 3    Feeling bad or failure about yourself  0 1 3  Trouble concentrating 0 2 0  Moving slowly or fidgety/restless 0 2 1  Suicidal thoughts 0 0 0  PHQ-9 Score 7 14 16   Difficult doing work/chores - Very difficult Very difficult   She is on Wellbutrin  GAD 7 : Generalized Anxiety Score 04/13/2021 04/10/2020 10/16/2018  Nervous, Anxious, on Edge 0 3 3  Control/stop worrying 1 3 3   Worry too much - different things 1 3 3   Trouble relaxing 0 2 3  Restless 0 2 3  Easily annoyed or irritable 1 3 3   Afraid - awful might happen 0 1 2  Total GAD 7 Score 3 17 20   Anxiety Difficulty - Very difficult Very difficult      Upstream - 04/13/21 0940       Pregnancy Intention Screening   Does the patient want to become pregnant in the next year? Unsure    Does the patient's partner want to become pregnant in the next year? No    Would the patient like to discuss contraceptive options today? No      Contraception Wrap Up   Current Method Female Condom    End Method Female Condom    Contraception Counseling Provided No            Examination chaperoned by LPN   Impression and Plan: 1. Encounter for gynecological examination with Papanicolaou smear of cervix Pap sent Physical in 1 year Pap in 3 if  normal Labs with PCP   2. Essential hypertension Will rx Norvasc Review DASH diet to decrease salt and sugars Increase water    Meds ordered this encounter  Medications   amLODipine (NORVASC) 5 MG tablet    Sig: Take 1 tablet (5 mg total) by mouth daily.    Dispense:  30 tablet    Refill:  6    Order Specific Question:   Supervising Provider    Answer:   Despina Hidden, LUTHER H [[id number]]   Recheck BP with me in 2 weeks   3. Morbid obesity (HCC) Try to lose some weight, 20 lbs will help Walk some

## 2021-04-13 NOTE — Patient Instructions (Signed)
https://www.nhlbi.nih.gov/files/docs/public/heart/dash_brief.pdf">  DASH Eating Plan DASH stands for Dietary Approaches to Stop Hypertension. The DASH eating plan is a healthy eating plan that has been shown to: Reduce high blood pressure (hypertension). Reduce your risk for type 2 diabetes, heart disease, and stroke. Help with weight loss. What are tips for following this plan? Reading food labels Check food labels for the amount of salt (sodium) per serving. Choose foods with less than 5 percent of the Daily Value of sodium. Generally, foods with less than 300 milligrams (mg) of sodium per serving fit into this eating plan. To find whole grains, look for the word "whole" as the first word in the ingredient list. Shopping Buy products labeled as "low-sodium" or "no salt added." Buy fresh foods. Avoid canned foods and pre-made or frozen meals. Cooking Avoid adding salt when cooking. Use salt-free seasonings or herbs instead of table salt or sea salt. Check with your health care provider or pharmacist before using salt substitutes. Do not fry foods. Cook foods using healthy methods such as baking, boiling, grilling, roasting, and broiling instead. Cook with heart-healthy oils, such as olive, canola, avocado, soybean, or sunflower oil. Meal planning  Eat a balanced diet that includes: 4 or more servings of fruits and 4 or more servings of vegetables each day. Try to fill one-half of your plate with fruits and vegetables. 6-8 servings of whole grains each day. Less than 6 oz (170 g) of lean meat, poultry, or fish each day. A 3-oz (85-g) serving of meat is about the same size as a deck of cards. One egg equals 1 oz (28 g). 2-3 servings of low-fat dairy each day. One serving is 1 cup (237 mL). 1 serving of nuts, seeds, or beans 5 times each week. 2-3 servings of heart-healthy fats. Healthy fats called omega-3 fatty acids are found in foods such as walnuts, flaxseeds, fortified milks, and eggs.  These fats are also found in cold-water fish, such as sardines, salmon, and mackerel. Limit how much you eat of: Canned or prepackaged foods. Food that is high in trans fat, such as some fried foods. Food that is high in saturated fat, such as fatty meat. Desserts and other sweets, sugary drinks, and other foods with added sugar. Full-fat dairy products. Do not salt foods before eating. Do not eat more than 4 egg yolks a week. Try to eat at least 2 vegetarian meals a week. Eat more home-cooked food and less restaurant, buffet, and fast food.  Lifestyle When eating at a restaurant, ask that your food be prepared with less salt or no salt, if possible. If you drink alcohol: Limit how much you use to: 0-1 drink a day for women who are not pregnant. 0-2 drinks a day for men. Be aware of how much alcohol is in your drink. In the [place] one drink equals one 12 oz bottle of beer (355 mL), one 5 oz glass of wine (148 mL), or one 1 oz glass of hard liquor (44 mL). General information Avoid eating more than 2,300 mg of salt a day. If you have hypertension, you may need to reduce your sodium intake to 1,500 mg a day. Work with your health care provider to maintain a healthy body weight or to lose weight. Ask what an ideal weight is for you. Get at least 30 minutes of exercise that causes your heart to beat faster (aerobic exercise) most days of the week. Activities may include walking, swimming, or biking. Work with your health care provider   or dietitian to adjust your eating plan to your individual calorie needs. What foods should I eat? Fruits All fresh, dried, or frozen fruit. Canned fruit in natural juice (without addedsugar). Vegetables Fresh or frozen vegetables (raw, steamed, roasted, or grilled). Low-sodium or reduced-sodium tomato and vegetable juice. Low-sodium or reduced-sodium tomatosauce and tomato paste. Low-sodium or reduced-sodium canned vegetables. Grains Whole-grain or  whole-wheat bread. Whole-grain or whole-wheat pasta. Brown rice. Oatmeal. Quinoa. Bulgur. Whole-grain and low-sodium cereals. Pita bread.Low-fat, low-sodium crackers. Whole-wheat flour tortillas. Meats and other proteins Skinless chicken or turkey. Ground chicken or turkey. Pork with fat trimmed off. Fish and seafood. Egg whites. Dried beans, peas, or lentils. Unsalted nuts, nut butters, and seeds. Unsalted canned beans. Lean cuts of beef with fat trimmed off. Low-sodium, lean precooked or cured meat, such as sausages or meatloaves. Dairy Low-fat (1%) or fat-free (skim) milk. Reduced-fat, low-fat, or fat-free cheeses. Nonfat, low-sodium ricotta or cottage cheese. Low-fat or nonfatyogurt. Low-fat, low-sodium cheese. Fats and oils Soft margarine without trans fats. Vegetable oil. Reduced-fat, low-fat, or light mayonnaise and salad dressings (reduced-sodium). Canola, safflower, olive, avocado, soybean, andsunflower oils. Avocado. Seasonings and condiments Herbs. Spices. Seasoning mixes without salt. Other foods Unsalted popcorn and pretzels. Fat-free sweets. The items listed above may not be a complete list of foods and beverages you can eat. Contact a dietitian for more information. What foods should I avoid? Fruits Canned fruit in a light or heavy syrup. Fried fruit. Fruit in cream or buttersauce. Vegetables Creamed or fried vegetables. Vegetables in a cheese sauce. Regular canned vegetables (not low-sodium or reduced-sodium). Regular canned tomato sauce and paste (not low-sodium or reduced-sodium). Regular tomato and vegetable juice(not low-sodium or reduced-sodium). Pickles. Olives. Grains Baked goods made with fat, such as croissants, muffins, or some breads. Drypasta or rice meal packs. Meats and other proteins Fatty cuts of meat. Ribs. Fried meat. Bacon. Bologna, salami, and other precooked or cured meats, such as sausages or meat loaves. Fat from the back of a pig (fatback). Bratwurst.  Salted nuts and seeds. Canned beans with added salt. Canned orsmoked fish. Whole eggs or egg yolks. Chicken or turkey with skin. Dairy Whole or 2% milk, cream, and half-and-half. Whole or full-fat cream cheese. Whole-fat or sweetened yogurt. Full-fat cheese. Nondairy creamers. Whippedtoppings. Processed cheese and cheese spreads. Fats and oils Butter. Stick margarine. Lard. Shortening. Ghee. Bacon fat. Tropical oils, suchas coconut, palm kernel, or palm oil. Seasonings and condiments Onion salt, garlic salt, seasoned salt, table salt, and sea salt. Worcestershire sauce. Tartar sauce. Barbecue sauce. Teriyaki sauce. Soy sauce, including reduced-sodium. Steak sauce. Canned and packaged gravies. Fish sauce. Oyster sauce. Cocktail sauce. Store-bought horseradish. Ketchup. Mustard. Meat flavorings and tenderizers. Bouillon cubes. Hot sauces. Pre-made or packaged marinades. Pre-made or packaged taco seasonings. Relishes. Regular saladdressings. Other foods Salted popcorn and pretzels. The items listed above may not be a complete list of foods and beverages you should avoid. Contact a dietitian for more information. Where to find more information National Heart, Lung, and Blood Institute: www.nhlbi.nih.gov American Heart Association: www.heart.org Academy of Nutrition and Dietetics: www.eatright.org National Kidney Foundation: www.kidney.org Summary The DASH eating plan is a healthy eating plan that has been shown to reduce high blood pressure (hypertension). It may also reduce your risk for type 2 diabetes, heart disease, and stroke. When on the DASH eating plan, aim to eat more fresh fruits and vegetables, whole grains, lean proteins, low-fat dairy, and heart-healthy fats. With the DASH eating plan, you should limit salt (sodium) intake to 2,300   mg a day. If you have hypertension, you may need to reduce your sodium intake to 1,500 mg a day. Work with your health care provider or dietitian to adjust  your eating plan to your individual calorie needs. This information is not intended to replace advice given to you by your health care provider. Make sure you discuss any questions you have with your healthcare provider. Document Revised: 09/07/2019 Document Reviewed: 09/07/2019 Elsevier Patient Education  2022 [employer] Inc.  

## 2021-04-15 LAB — CYTOLOGY - PAP
Chlamydia: NEGATIVE
Comment: NEGATIVE
Comment: NEGATIVE
Comment: NORMAL
Diagnosis: UNDETERMINED — AB
High risk HPV: NEGATIVE
Neisseria Gonorrhea: NEGATIVE

## 2021-04-16 ENCOUNTER — Encounter: Payer: Self-pay | Admitting: Adult Health

## 2021-04-16 ENCOUNTER — Telehealth: Payer: Self-pay | Admitting: Adult Health

## 2021-04-16 DIAGNOSIS — A599 Trichomoniasis, unspecified: Secondary | ICD-10-CM

## 2021-04-16 DIAGNOSIS — R87610 Atypical squamous cells of undetermined significance on cytologic smear of cervix (ASC-US): Secondary | ICD-10-CM

## 2021-04-16 HISTORY — DX: Trichomoniasis, unspecified: A59.9

## 2021-04-16 HISTORY — DX: Atypical squamous cells of undetermined significance on cytologic smear of cervix (ASC-US): R87.610

## 2021-04-16 MED ORDER — METRONIDAZOLE 500 MG PO TABS: 500.0000 mg | ORAL_TABLET | Freq: Two times a day (BID) | ORAL | 0 refills | Status: DC

## 2021-04-16 NOTE — Telephone Encounter (Signed)
Pt aware that pap ASCUS, negative GC.CHL and HPV but +trich and BV, rx sent in for flagyl, no sex. Make appt for self swab POC in about 2 weeks and partner needs to be treated

## 2021-04-27 ENCOUNTER — Other Ambulatory Visit: Payer: 59 | Admitting: Adult Health

## 2021-04-30 ENCOUNTER — Other Ambulatory Visit: Payer: 59 | Admitting: *Deleted

## 2021-04-30 ENCOUNTER — Other Ambulatory Visit: Payer: Self-pay

## 2021-04-30 DIAGNOSIS — Z8619 Personal history of other infectious and parasitic diseases: Secondary | ICD-10-CM

## 2021-04-30 NOTE — Progress Notes (Signed)
Chart reviewed for nurse visit. Agree with plan of care.  Adline Potter, NP 04/30/2021 3:45 PM

## 2021-04-30 NOTE — Progress Notes (Signed)
   NURSE VISIT- VAGINITIS/STD/POC  SUBJECTIVE:  Michelle Tate is a 25 y.o. G0P0000 GYN patientfemale here for a vaginal swab for proof of cure after treatment for Trichomonas.  She reports the following symptoms: none for 0 day. Denies abnormal vaginal bleeding, significant pelvic pain, fever, or UTI symptoms.  OBJECTIVE:  LMP 04/07/2021   Appears well, in no apparent distress  ASSESSMENT: Vaginal swab for proof of cure after treatment for trichomonas  PLAN: Self-collected urine sample for Trichomonas sent to lab Treatment: to be determined once results are received Follow-up as needed if symptoms persist/worsen, or new symptoms develop  Annamarie Dawley  04/30/2021 9:06 AM

## 2021-05-02 LAB — TRICHOMONAS VAGINALIS, PROBE AMP: Trich vag by NAA: POSITIVE — AB

## 2021-05-03 ENCOUNTER — Other Ambulatory Visit: Payer: Self-pay | Admitting: Adult Health

## 2021-05-03 MED ORDER — METRONIDAZOLE 500 MG PO TABS: 500.0000 mg | ORAL_TABLET | Freq: Two times a day (BID) | ORAL | 0 refills | Status: DC

## 2021-05-03 NOTE — Progress Notes (Signed)
+  trich, on vaginal swab will rx flagyl

## 2021-09-21 ENCOUNTER — Other Ambulatory Visit: Payer: Self-pay

## 2021-09-21 ENCOUNTER — Encounter: Payer: Self-pay | Admitting: Nurse Practitioner

## 2021-09-21 ENCOUNTER — Ambulatory Visit (INDEPENDENT_AMBULATORY_CARE_PROVIDER_SITE_OTHER): Payer: 59 | Admitting: Nurse Practitioner

## 2021-09-21 VITALS — BP 129/84 | HR 96 | Ht 66.0 in | Wt 330.0 lb

## 2021-09-21 DIAGNOSIS — I10 Essential (primary) hypertension: Secondary | ICD-10-CM | POA: Diagnosis not present

## 2021-09-21 DIAGNOSIS — Z139 Encounter for screening, unspecified: Secondary | ICD-10-CM | POA: Diagnosis not present

## 2021-09-21 DIAGNOSIS — F321 Major depressive disorder, single episode, moderate: Secondary | ICD-10-CM

## 2021-09-21 DIAGNOSIS — F411 Generalized anxiety disorder: Secondary | ICD-10-CM

## 2021-09-21 DIAGNOSIS — E559 Vitamin D deficiency, unspecified: Secondary | ICD-10-CM

## 2021-09-21 DIAGNOSIS — E119 Type 2 diabetes mellitus without complications: Secondary | ICD-10-CM | POA: Diagnosis not present

## 2021-09-21 DIAGNOSIS — J301 Allergic rhinitis due to pollen: Secondary | ICD-10-CM

## 2021-09-21 DIAGNOSIS — J4530 Mild persistent asthma, uncomplicated: Secondary | ICD-10-CM

## 2021-09-21 HISTORY — DX: Encounter for screening, unspecified: Z13.9

## 2021-09-21 LAB — POCT GLYCOSYLATED HEMOGLOBIN (HGB A1C): Hemoglobin A1C: 6 % — AB (ref 4.0–5.6)

## 2021-09-21 MED ORDER — AMLODIPINE BESYLATE 5 MG PO TABS: 5.0000 mg | ORAL_TABLET | Freq: Every day | ORAL | 6 refills | Status: DC

## 2021-09-21 MED ORDER — ESCITALOPRAM OXALATE 5 MG PO TABS: 5.0000 mg | ORAL_TABLET | Freq: Every day | ORAL | 2 refills | Status: DC

## 2021-09-21 MED ORDER — OZEMPIC (0.25 OR 0.5 MG/DOSE) 2 MG/1.5ML ~~LOC~~ SOPN: PEN_INJECTOR | SUBCUTANEOUS | 2 refills | Status: DC

## 2021-09-21 NOTE — Assessment & Plan Note (Signed)
Use flonase daily Claritin 10 mg daily.

## 2021-09-21 NOTE — Assessment & Plan Note (Addendum)
Wt Readings from Last 3 Encounters:  09/21/21 (!) 330 lb (149.7 kg)  04/13/21 (!) 347 lb (157.4 kg)  03/17/21 (!) 320 lb (145.2 kg)  Importance of portion control and  regular vigorous exercise 30 minutes 5 times a week discussed with PT. PT stated that she signed up at the gym for exercise and will start exercising soon.  She has a goal to loose 10 pound in the next one month.  Restart ozempic 0.25mg  weekly for 4 weeks                             0.5mg   weekly for 4 weeks                             Titrate med up as tolerated

## 2021-09-21 NOTE — Assessment & Plan Note (Signed)
Lab Results  Component Value Date   HGBA1C 6.0 (A) 09/21/2021  restart ozempic.

## 2021-09-21 NOTE — Assessment & Plan Note (Signed)
GAD7 score 18.  Start lexapro 5 mg, refer to psych for counseling.
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
## 2021-09-21 NOTE — Assessment & Plan Note (Signed)
PHQ score 9.  lexapro 5 mg daily , will titrate med up as tolerated Refer to psych for counseling.

## 2021-09-21 NOTE — Assessment & Plan Note (Signed)
Check vitamin D level 

## 2021-09-21 NOTE — Assessment & Plan Note (Addendum)
Stable condition. Use albuterol inhaler as needed.  Pt tadvised to stop vaping

## 2021-09-21 NOTE — Assessment & Plan Note (Deleted)
Stable condition. Use albuterol inhaler as needed.

## 2021-09-21 NOTE — Patient Instructions (Addendum)
Please get your labs 3-5 days before your next visit. Take amlodipine 5 mg daily for high blood pressure. Leaxpro 5 mg daily for depression Ozempic has been refilled for diabetes.  Take 0.25mg  weekly for 4 weeks, then 0.5mg  weekly for another 4 weeks. We will increase dose as tolerated.   It is important that you exercise regularly at least 30 minutes 5 times a week.  Think about what you will eat, plan ahead. Choose " clean, green, fresh or frozen" over canned, processed or packaged foods which are more sugary, salty and fatty. 70 to 75% of food eaten should be vegetables and fruit. Three meals at set times with snacks allowed between meals, but they must be fruit or vegetables. Aim to eat over a 12 hour period , example 7 am to 7 pm, and STOP after  your last meal of the day. Drink water,generally about 64 ounces per day, no other drink is as healthy. Fruit juice is best enjoyed in a healthy way, by EATING the fruit.  Thanks for choosing [facility], we consider it a privelige to serve you.

## 2021-09-21 NOTE — Assessment & Plan Note (Signed)
BP Readings from Last 3 Encounters:  09/21/21 129/84  04/13/21 (!) 184/116  03/17/21 138/90  start using amlodipine 5mg  daily.  Loose weight, avoid salty food.

## 2021-11-02 ENCOUNTER — Ambulatory Visit: Payer: 59 | Admitting: Nurse Practitioner

## 2021-11-27 ENCOUNTER — Encounter: Payer: Self-pay | Admitting: Nurse Practitioner

## 2021-11-27 ENCOUNTER — Other Ambulatory Visit: Payer: Self-pay

## 2021-11-27 ENCOUNTER — Ambulatory Visit (INDEPENDENT_AMBULATORY_CARE_PROVIDER_SITE_OTHER): Payer: 59 | Admitting: Nurse Practitioner

## 2021-11-27 VITALS — BP 138/82 | HR 92 | Ht 67.0 in | Wt 333.1 lb

## 2021-11-27 DIAGNOSIS — Z23 Encounter for immunization: Secondary | ICD-10-CM

## 2021-11-27 DIAGNOSIS — Z7189 Other specified counseling: Secondary | ICD-10-CM

## 2021-11-27 DIAGNOSIS — E119 Type 2 diabetes mellitus without complications: Secondary | ICD-10-CM | POA: Diagnosis not present

## 2021-11-27 DIAGNOSIS — I10 Essential (primary) hypertension: Secondary | ICD-10-CM

## 2021-11-27 DIAGNOSIS — F321 Major depressive disorder, single episode, moderate: Secondary | ICD-10-CM

## 2021-11-27 HISTORY — DX: Encounter for immunization: Z23

## 2021-11-27 HISTORY — DX: Other specified counseling: Z71.89

## 2021-11-27 MED ORDER — SEMAGLUTIDE-WEIGHT MANAGEMENT 1.7 MG/0.75ML ~~LOC~~ SOAJ: 1.7000 mg | SUBCUTANEOUS | 0 refills | Status: DC

## 2021-11-27 MED ORDER — SEMAGLUTIDE-WEIGHT MANAGEMENT 1 MG/0.5ML ~~LOC~~ SOAJ: 1.0000 mg | SUBCUTANEOUS | 0 refills | Status: AC

## 2021-11-27 MED ORDER — ESCITALOPRAM OXALATE 10 MG PO TABS: 10.0000 mg | ORAL_TABLET | Freq: Every day | ORAL | 3 refills | Status: AC

## 2021-11-27 MED ORDER — SEMAGLUTIDE-WEIGHT MANAGEMENT 1 MG/0.5ML ~~LOC~~ SOAJ: 1.0000 mg | SUBCUTANEOUS | 0 refills | Status: DC

## 2021-11-27 MED ORDER — SEMAGLUTIDE-WEIGHT MANAGEMENT 1.7 MG/0.75ML ~~LOC~~ SOAJ: 1.7000 mg | SUBCUTANEOUS | 0 refills | Status: AC

## 2021-11-27 NOTE — Assessment & Plan Note (Signed)
Wt Readings from Last 3 Encounters:  11/27/21 (!) 333 lb 1.3 oz (151.1 kg)  09/21/21 (!) 330 lb (149.7 kg)  04/13/21 (!) 347 lb (157.4 kg)  Importance of healthy food choices with portion control discussed as well as eating regularly within 12  hour window.   The need to choose clean green food 50%-75% of time is discussed as well as make water the primary drink and set a goal for 64 ounces daily.  Patient reeducated about the importance of committment to minimum of 150 minutes of exercise per week.  Three meals at set times with snacks allowed between meals but they must be fruit or vegetable.   Aim to eat  over 12 hour period  for example 7 am to 7 pm. Stop after your last meal of the day.  Wt Readings from Last 3 Encounters:  11/27/21 (!) 333 lb 1.3 oz (151.1 kg)  09/21/21 (!) 330 lb (149.7 kg)  04/13/21 (!) 347 lb (157.4 kg)  she was started on wegovy 2 months ago, doing the 0.5mg  once weekly now, states that she is tolerating med well.   will start going to the gym.  She is till eating sweets  Importance of healthy food choices with portion control discussed as well as eating regularly within 12  hour window.   The need to choose clean green food 50%-75% of time is discussed as well as make water the primary drink and set a goal for 64 ounces daily.  Patient reeducated about the importance of committment to minimum of 150 minutes of exercise per week.  Three meals at set times with snacks allowed between meals but they must be fruit or vegetable.   Aim to eat  over 12 hour period  for example 7 am to 7 pm. Stop after your last meal of the day.

## 2021-11-27 NOTE — Assessment & Plan Note (Addendum)
She did not get her labs done. Pt told to get her labs done as soon as she can. She is not fasting today. Tolerating ozempic well  increase dosage of ozempic today. Need to start statin/ACEI/ARB Schedule diabetic eye exam today.

## 2021-11-27 NOTE — Assessment & Plan Note (Signed)
PHQ 9 score 14. Tolerating lexapro well , start lexapro 10 mg daily, refer for counseling.  Denies SI. HI

## 2021-11-27 NOTE — Assessment & Plan Note (Signed)
Recently lost her dad. Refer to counseling Start lexapro 10mg  daily  Denies SI, HI.

## 2021-11-27 NOTE — Progress Notes (Signed)
° °  Michelle Tate     MRN: [id number]      DOB: [birth year]   HPI Michelle Tate is here for follow up and re-evaluation of chronic medical conditions, medication management and review of any available recent lab and radiology data.  Preventive health is updated, specifically  Immunization.   Questions or concerns regarding consultations or procedures which the PT has had in the interim are  addressed. The PT denies any adverse reactions to current medications since the last visit.  There are no new concerns.  There are no specific complaints   Her dad passed away ending of 2022/11/05 room heart disease. Denies SI, HI she has been taking lexapro 5mg  daily , med has not been effective.  She would like  higher dose of lexapro and speak to a therapist.    She restarted taking ozempic, tolerating med well, need prescription for higher dose.   She has been taking amlodipine 5mg  for BP, denies any side effect from med.   ROS Denies recent fever or chills. Denies sinus pressure, nasal congestion, ear pain or sore throat. Denies chest congestion, productive cough or wheezing. Denies chest pains, palpitations and leg swelling Denies abdominal pain, nausea, vomiting,diarrhea or constipation.   Denies dysuria, frequency, hesitancy or incontinence. Denies joint pain, swelling and limitation in mobility. Denies headaches, seizures, numbness, or tingling. Has depression, anxiety denies SI, HI    PE  BP 138/82 (BP Location: Right Arm, Cuff Size: Large)    Pulse 92    Ht 5\' 7"  (1.702 m)    Wt (!) 333 lb 1.3 oz (151.1 kg)    LMP 11/26/2021 (Exact Date)    SpO2 99%    BMI 52.17 kg/m   Patient alert and oriented and in no cardiopulmonary distress.   Chest: Clear to auscultation bilaterally.  CVS: S1, S2 no murmurs, no S3.Regular rate.  ABD: Soft non tender. [place]  Ext: No edema  MS: Adequate ROM spine, shoulders, hips and knees.  Psych: Good eye contact, normal affect. Memory intact not  anxious or depressed appearing.  CNS: CN 2-12 intact, power,  normal throughout.no focal deficits noted.   Assessment & Plan

## 2021-11-27 NOTE — Assessment & Plan Note (Signed)
Patient educated on CDC recommendation for the vaccine. Verbal consent was obtained from the patient, vaccine administered by nurse, no sign of adverse reactions noted at this time. Patient education on arm soreness and use of tylenol or ibuprofen (if safe) for this patient  was discussed. Patient educated on the signs and symptoms of adverse effect and advise to contact the office if they occur °

## 2021-11-27 NOTE — Patient Instructions (Addendum)
Please get your fasting labs done as discussed.   wegovy Week 9 through week 12: 1 mg once weekly. Week 13 through week 16: 1.7 mg once weekly.     It is important that you exercise regularly at least 30 minutes 5 times a week.  Think about what you will eat, plan ahead. Choose " clean, green, fresh or frozen" over canned, processed or packaged foods which are more sugary, salty and fatty. 70 to 75% of food eaten should be vegetables and fruit. Three meals at set times with snacks allowed between meals, but they must be fruit or vegetables. Aim to eat over a 12 hour period , example 7 am to 7 pm, and STOP after  your last meal of the day. Drink water,generally about 64 ounces per day, no other drink is as healthy. Fruit juice is best enjoyed in a healthy way, by EATING the fruit.  Thanks for choosing [facility], we consider it a privelige to serve you.

## 2021-11-27 NOTE — Assessment & Plan Note (Addendum)
DASH diet and commitment to daily physical activity for a minimum of 30 minutes discussed and encouraged, as a part of hypertension management. The importance of attaining a healthy weight is also discussed.  BP/Weight 11/27/2021 09/21/2021 04/13/2021 03/17/2021 02/09/2021 01/25/2021 [id number]  Systolic BP 0000000 [id number] Q000111Q 0000000 - 0000000 0000000  Diastolic BP 82 84 99991111 90 - 76 84  Wt. (Lbs) 333.08 330 347 320 341 340 350.4  BMI 52.17 53.26 56.01 50.12 55.04 54.88 56.9  Some encounter information is confidential and restricted. Go to Review Flowsheets activity to see all data.  bp improved on amlodipine 5 mg daily. Pt will get her labs done.  Plan to add losartan once labs are back.

## 2021-12-01 ENCOUNTER — Telehealth: Payer: Self-pay | Admitting: *Deleted

## 2021-12-01 NOTE — Chronic Care Management (AMB) (Signed)
°  Care Management   Outreach Note  12/01/2021 Name: Michelle Tate MRN: [id number] DOB: 1996/01/28  Referred by: Donell Beers, FNP Reason for referral : Care Coordination (Initial outreach to schedule referral with SW )   An unsuccessful telephone outreach was attempted today. The patient was referred to the case management team for assistance with care management and care coordination.   Follow Up Plan:  A HIPAA compliant phone message was left for the patient providing contact information and requesting a return call.  The care management team will reach out to the patient again over the next 7 days.  If patient returns call to provider office, please advise to call Embedded Care Management Care Guide Misty Stanley* at [phone]  Care Guide, Embedded Care Coordination [facility] Health   Care Management  Direct Dial: [phone]

## 2021-12-08 NOTE — Chronic Care Management (AMB) (Signed)
°  Care Management   Outreach Note  12/08/2021 Name: Michelle Tate MRN: [id number] DOB: 1996-04-01  Referred by: Renee Rival, FNP Reason for referral : Care Coordination (Initial outreach to schedule referral with SW )   A second unsuccessful telephone outreach was attempted today. The patient was referred to the case management team for assistance with care management and care coordination.   Follow Up Plan:  A HIPAA compliant phone message was left for the patient providing contact information and requesting a return call.  The care management team will reach out to the patient again over the next 7 days. If patient returns call to provider office, please advise to call [place] at [phone].  [place] Management  Direct Dial: [phone]

## 2021-12-15 NOTE — Chronic Care Management (AMB) (Signed)
°  Care Management   Note  12/15/2021 Name: Michelle Tate MRN: [id number] DOB: January 27, 1996  [name] is a 26 y.o. year old female who is a primary care patient of Donell Beers, FNP. I reached out to [name] by phone today in response to a referral sent by Ms. Traci Sermon Linan's primary care provider.   [name] was given information about care management services today including:  Care management services include personalized support from designated clinical staff supervised by her physician, including individualized plan of care and coordination with other care providers 24/7 contact phone numbers for assistance for urgent and routine care needs. The patient may stop care management services at any time by phone call to the office staff.  Patient agreed to services and verbal consent obtained.   Follow up plan: Telephone appointment with care management team member scheduled for:02/05/22  [facility] Guide, Embedded Care Coordination [facility] Health   Care Management  Direct Dial: [phone]

## 2022-01-14 IMAGING — CT CT RENAL STONE PROTOCOL
2 of 4 series · 17 of 46 positions shown, 19 images · non-contrast
Comparison: October 23, 2017.

CLINICAL DATA: Acute left-sided abdominal pain.

EXAM:
CT ABDOMEN AND PELVIS WITHOUT CONTRAST
TECHNIQUE: Multidetector CT imaging of the abdomen and pelvis was performed
following the standard protocol without IV contrast.

[Series 2: axial st · axial · 0.89mm/px · z∈[-926,-466]mm · 14 of 104 slices shown, 16 images]
[im 6/104  soft-tissue]
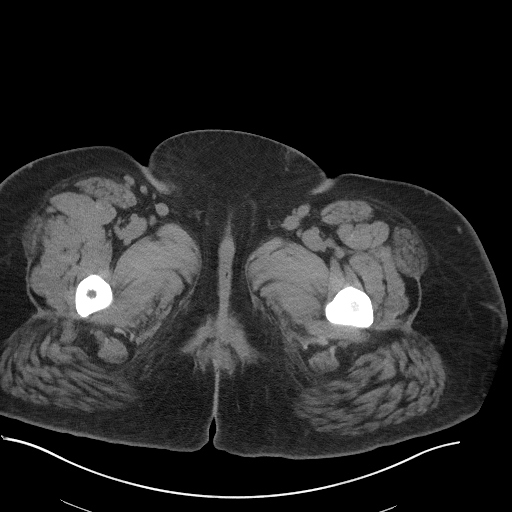
[im 6/104  bone]
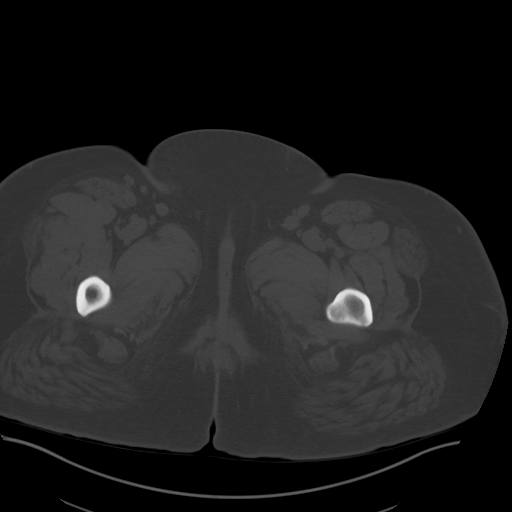
[im 16/104  soft-tissue]
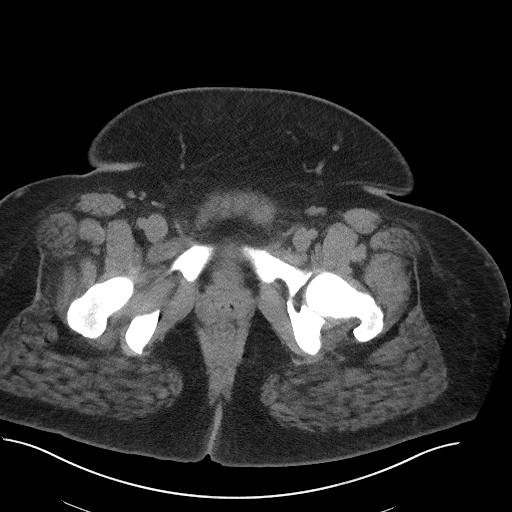
[im 21/104  soft-tissue]
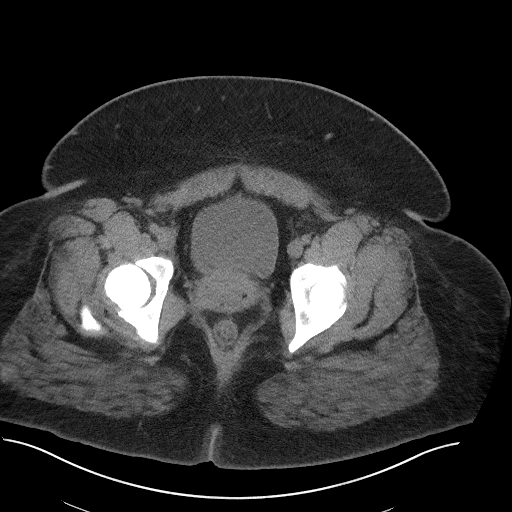
[im 26/104  soft-tissue]
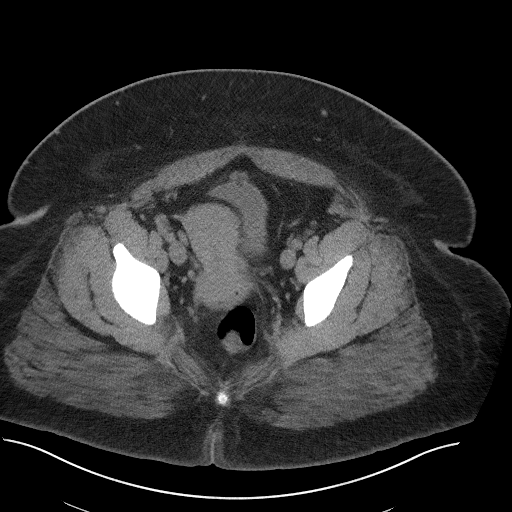
[im 37/104  soft-tissue]
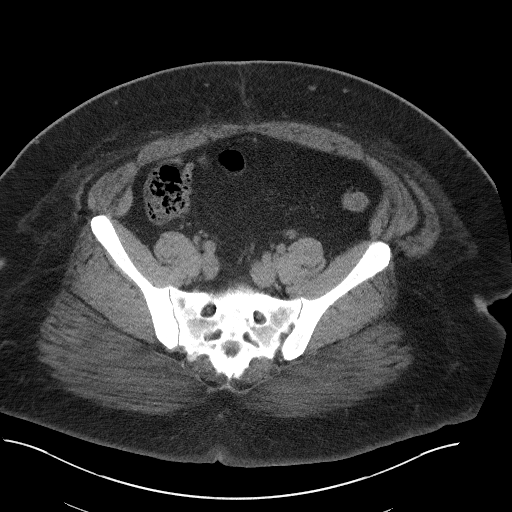
[im 42/104  soft-tissue]
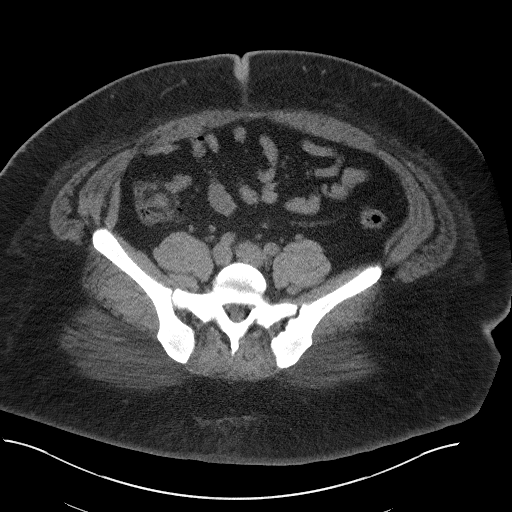
[im 47/104  soft-tissue]
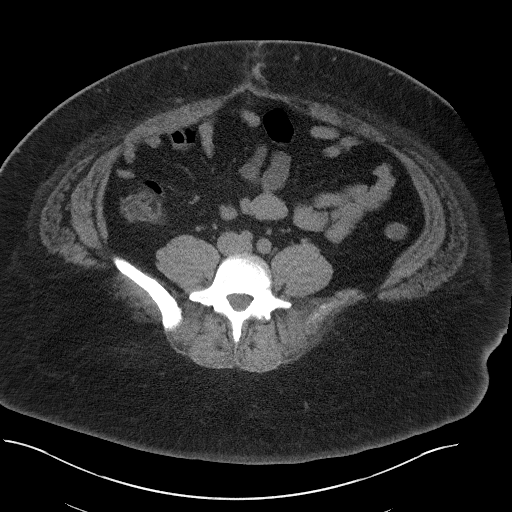
[im 57/104  soft-tissue]
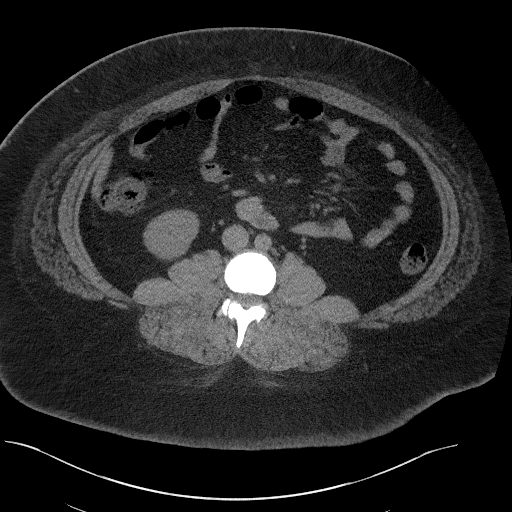
[im 62/104  soft-tissue]
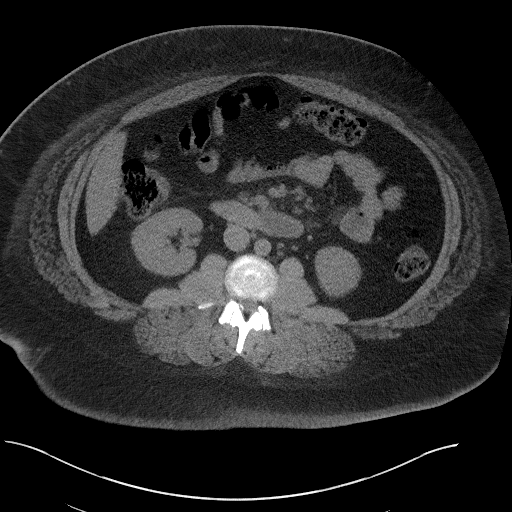
[im 62/104  bone]
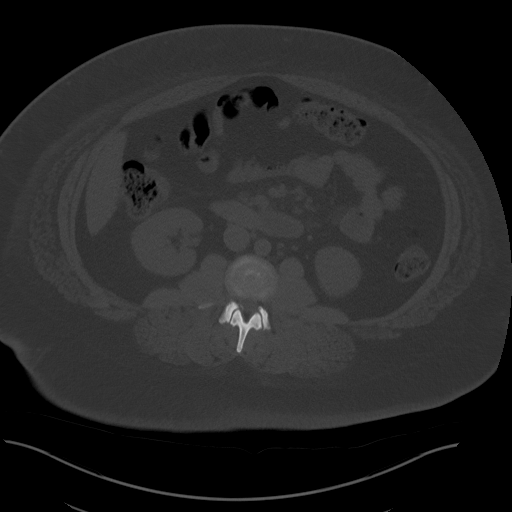
[im 67/104  soft-tissue]
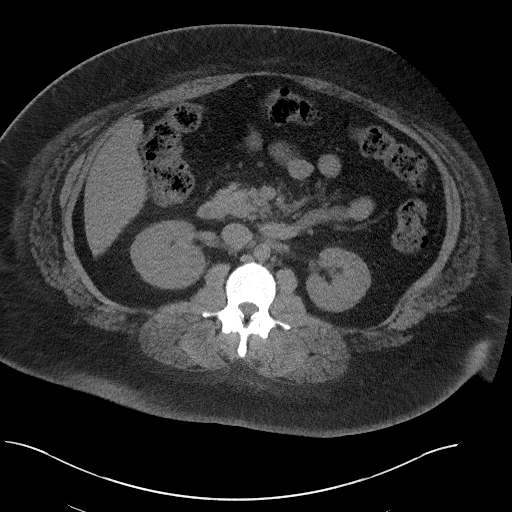
[im 78/104  soft-tissue]
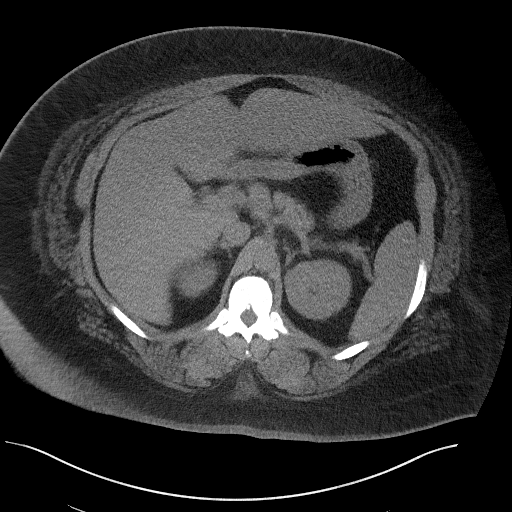
[im 83/104  soft-tissue]
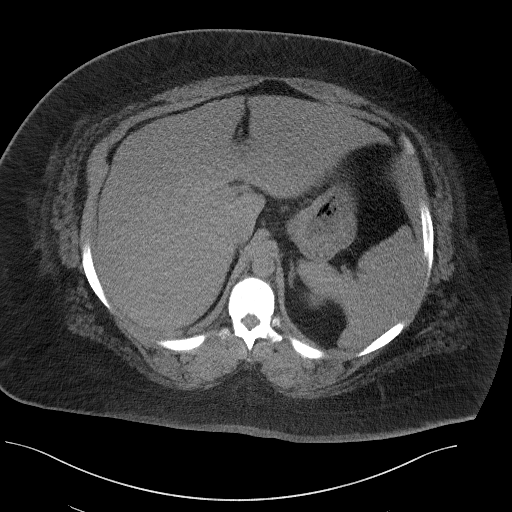
[im 88/104  soft-tissue]
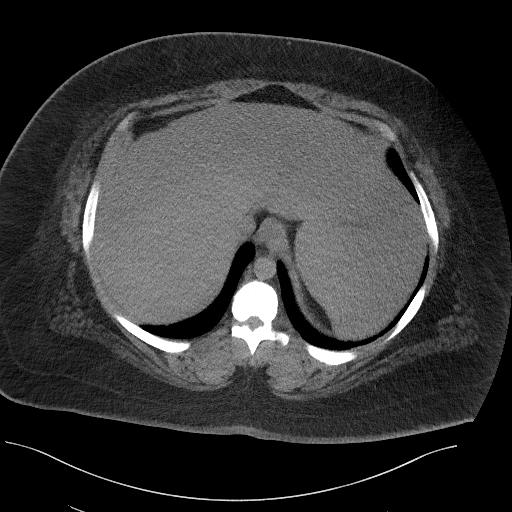
[im 98/104  soft-tissue]
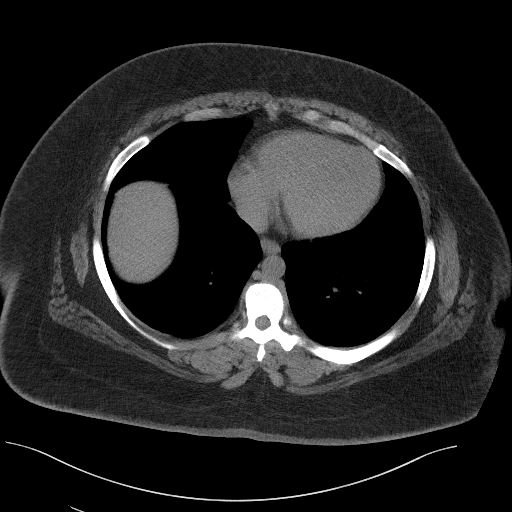

[Series 4: coronal st · coronal · 0.94mm/px · 3 of 138 slices shown]
[im 46/138  soft-tissue]
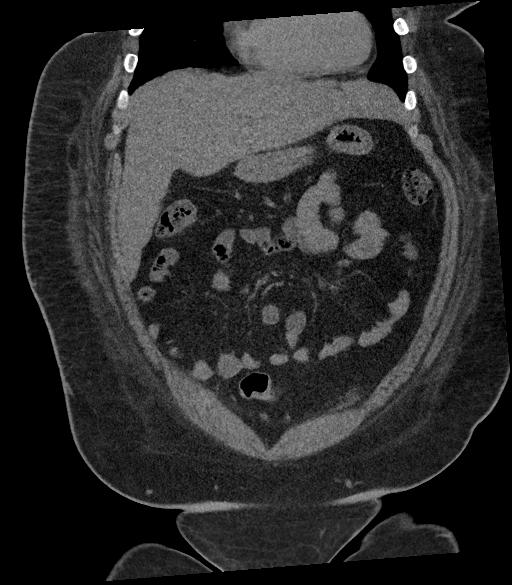
[im 61/138  soft-tissue]
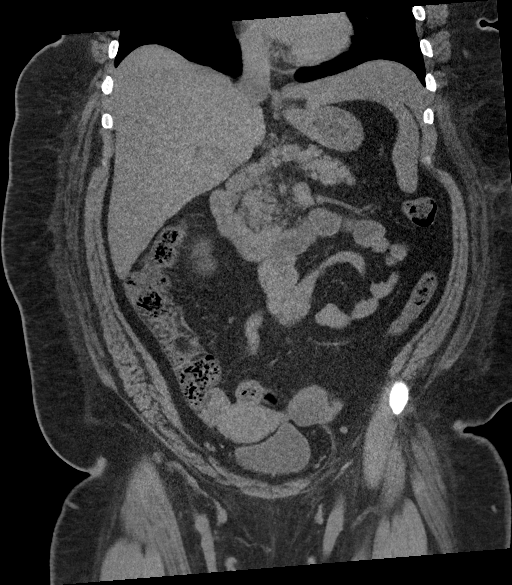
[im 77/138  soft-tissue]
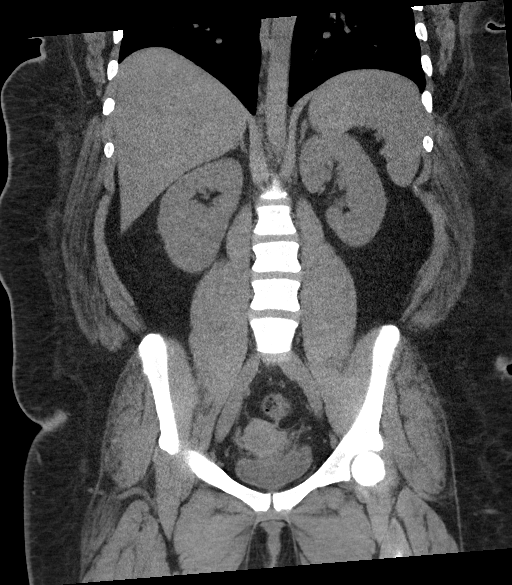

[17 of 46 positions shown; findings below may reference images not displayed]

FINDINGS: Lower chest: No acute abnormality.

Hepatobiliary: No focal liver abnormality is seen. Status post
cholecystectomy. No biliary dilatation.

Pancreas: Unremarkable. No pancreatic ductal dilatation or
surrounding inflammatory changes.

Spleen: Normal in size without focal abnormality.

Adrenals/Urinary Tract: Adrenal glands are unremarkable. Kidneys are
normal, without renal calculi, focal lesion, or hydronephrosis.
Bladder is unremarkable.

Stomach/Bowel: Stomach is within normal limits. Appendix appears
normal. No evidence of bowel wall thickening, distention, or
inflammatory changes.

Vascular/Lymphatic: No significant vascular findings are present. No
enlarged abdominal or pelvic lymph nodes.

Reproductive: Uterus and bilateral adnexa are unremarkable.

Other: No abdominal wall hernia or abnormality. No abdominopelvic
ascites.

Musculoskeletal: No acute or significant osseous findings.
IMPRESSION: No acute abnormality seen in the abdomen or pelvis.

## 2022-01-22 ENCOUNTER — Ambulatory Visit: Payer: 59 | Admitting: Nurse Practitioner

## 2022-02-05 ENCOUNTER — Telehealth: Payer: 59

## 2022-02-12 ENCOUNTER — Telehealth: Payer: 59

## 2022-02-12 ENCOUNTER — Telehealth: Payer: Self-pay | Admitting: Licensed Clinical Social Worker

## 2022-02-12 NOTE — Telephone Encounter (Signed)
?  Care Management  ?  ?Clinical Social Work Note ?  ?02/12/22 ?Name: Michelle Tate           MRN: [id number]       DOB: 10/06/1996 ?  ?Michelle Tate is a 26 y.o. year old female who is a primary care patient of Renee Rival, FNP. I reached out to Janace Litten by phone today in response to a referral sent by Ms. Loran Senters Lentsch's primary care provider.  ? ?LCSW called client phone number 2 times today but was not able to speak via phone with client. LCSW  did leave phone message for [name] asking her to call LCSW at [phone] ? ?Follow Up Plan:  Care Guide scheduler to contact client to reschedule client phone visit with LCSW. ? ?Norva Riffle.Tyeson Tanimoto MSW, LCSW ?Licensed Clinical Social Worker ?[place] Management ?[phone] ?

## 2022-03-17 ENCOUNTER — Other Ambulatory Visit (HOSPITAL_COMMUNITY): Payer: Self-pay

## 2022-03-17 ENCOUNTER — Telehealth: Payer: Self-pay | Admitting: Licensed Clinical Social Worker

## 2022-03-17 ENCOUNTER — Telehealth: Payer: 59

## 2022-03-17 NOTE — Chronic Care Management (AMB) (Signed)
    Clinical Social Work  Care Management   Phone Outreach    03/17/2022 Name: Michelle Tate MRN: [id number] DOB: 01-Apr-1996  Michelle Tate is a 26 y.o. year old female who is a primary care patient of Donell Beers, FNP .   Reason for referral: Grief Counseling.   Two attempts made today.  CCM LCSW reached out to patient today by phone to introduce self, assess needs and offer Care Management services and interventions.    Telephone outreach was unsuccessful. Unable to leave a HIPPA compliant phone message due to full voicemail.  Plan:Will route chart to Care Guide to see if patient would like to reschedule phone appointment   Review of patient status, including review of consultants reports, relevant laboratory and other test results, and collaboration with appropriate care team members and the patient's provider was performed as part of comprehensive patient evaluation and provision of care management services.    Sammuel Hines, LCSW Licensed Clinical Social Worker [facility]  [facility] [phone]

## 2022-03-19 ENCOUNTER — Encounter: Payer: 59 | Admitting: Nurse Practitioner

## 2022-03-23 ENCOUNTER — Telehealth: Payer: Self-pay | Admitting: *Deleted

## 2022-03-23 NOTE — Chronic Care Management (AMB) (Signed)
  Care Coordination Note  03/23/2022 Name: Michelle Tate MRN: [id number] DOB: November 30, 1995  Michelle Tate is a 26 y.o. year old female who is a primary care patient of Paseda, Dewaine Conger, FNP and is actively engaged with the care management team. I reached out to Michelle Tate by phone today to assist with re-scheduling an initial visit with the Licensed Clinical Social Worker  Follow up plan: Unsuccessful telephone outreach attempt made. A HIPAA compliant phone message was left for the patient providing contact information and requesting a return call.  The care management team will reach out to the patient again over the next 7 days.  If patient returns call to provider office, please advise to call [place] at [phone].  [place] Management  Direct Dial: [phone]

## 2022-03-31 NOTE — Chronic Care Management (AMB) (Signed)
  Care Coordination Note  03/31/2022 Name: Michelle Tate MRN: [id number] DOB: May 12, 1996  Michelle Tate is a 26 y.o. year old female who is a primary care patient of Paseda, Baird Kay, FNP and is actively engaged with the care management team. I reached out to Michelle Tate by phone today to assist with re-scheduling an initial visit with the Licensed Clinical Social Worker  Follow up plan: Unsuccessful telephone outreach attempt made. A HIPAA compliant phone message was left for the patient providing contact information and requesting a return call.  The care management team will reach out to the patient again over the next 7 days.  If patient returns call to provider office, please advise to call Embedded Care Management Care Guide Misty Stanley at [phone].  Gwenevere Ghazi  Care Guide, Embedded Care Coordination [facility] Management  Direct Dial: [phone]

## 2022-04-05 NOTE — Chronic Care Management (AMB) (Signed)
  Care Coordination Note  04/05/2022 Name: PEARLENA OW MRN: [id number] DOB: 05-12-1996  Michelle Tate is a 26 y.o. year old female who is a primary care patient of Paseda, Baird Kay, FNP and is actively engaged with the care management team. I reached out to Michelle Tate by phone today to assist with re-scheduling an initial visit with the Licensed Clinical Social Worker  Follow up plan: Telephone appointment with care management team member scheduled for:04/16/22  Gwenevere Ghazi  Care Guide, Embedded Care Coordination [facility] Health  Care Management  Direct Dial: [phone]

## 2022-04-16 ENCOUNTER — Telehealth: Payer: 59 | Admitting: Licensed Clinical Social Worker

## 2022-04-16 ENCOUNTER — Telehealth: Payer: Self-pay | Admitting: Licensed Clinical Social Worker

## 2022-04-16 NOTE — Telephone Encounter (Signed)
  Care Coordination Clinical Social Work Note   04/16/22    Name: Michelle Tate           MRN: [id number]       DOB: 29-Apr-1996   [name] is a 26 y.o. year old female who is a primary care patient of Paseda, Baird Kay, FNP and is actively engaged with the care management team.  LCSW called client at 3 separate times today but LCSW was not able to speak via phone with client. Phone answering machine was full; thus, LCSW could not leave client a message  Follow Up Plan.  This attempt to reach client today by LCSW represented the 2nd call attempt to reach client for initial SW assessment.  Care Guide scheduler to re-schedule LCSW phone visit with client (3rd attempt}    Kelton Pillar.Chantella Creech MSW, LCSW Licensed Visual merchandiser [facility] Care Management [phone]

## 2022-04-19 ENCOUNTER — Telehealth: Payer: Self-pay | Admitting: *Deleted

## 2022-04-19 NOTE — Chronic Care Management (AMB) (Unsigned)
  Care Coordination Note  04/19/2022 Name: Michelle Tate MRN: [id number] DOB: 07-05-1996  Michelle Tate is a 26 y.o. year old female who is a primary care patient of Paseda, Baird Kay, FNP and is actively engaged with the care management team. I reached out to Michelle Tate by phone today to assist with re-scheduling an initial visit with the Licensed Clinical Social Worker  Follow up plan: Unsuccessful telephone outreach attempt made. A HIPAA compliant phone message was left for the patient providing contact information and requesting a return call.  The care management team will reach out to the patient again over the next 7 days.  If patient returns call to provider office, please advise to call Embedded Care Management Care Guide Michelle Tate at [phone].  Gwenevere Ghazi  Care Guide, Embedded Care Coordination [facility] Management  Direct Dial: [phone]

## 2022-04-21 NOTE — Chronic Care Management (AMB) (Unsigned)
  Care Coordination Note  04/21/2022 Name: Michelle Tate MRN: [id number] DOB: Nov 15, 1995  Michelle Tate is a 26 y.o. year old female who is a primary care patient of Paseda, Baird Kay, FNP and is actively engaged with the care management team. I reached out to Michelle Tate by phone today to assist with re-scheduling an initial visit with the Licensed Clinical Social Worker  Follow up plan: Unsuccessful telephone outreach attempt made. A HIPAA compliant phone message was left for the patient providing contact information and requesting a return call.  The care management team will reach out to the patient again over the next 1-2 days.  If patient returns call to provider office, please advise to call Embedded Care Management Care Guide Misty Stanley at [phone].  Gwenevere Ghazi  Care Guide [facility] Management  Direct Dial: [phone]

## 2022-04-22 NOTE — Chronic Care Management (AMB) (Signed)
  Care Coordination Note  04/22/2022 Name: [name] MRN: [id number] DOB: 07-31-1996  Michelle Tate is a 26 y.o. year old female who is a primary care patient of Paseda, Baird Kay, FNP and is actively engaged with the care management team. I reached out to Michelle Tate by phone today to assist with re-scheduling an initial visit with the Licensed Clinical Social Worker  Follow up plan: Telephone appointment with care management team member scheduled for:04/23/22  Gwenevere Ghazi  Care Guide [facility]  Care Management  Direct Dial: [phone]

## 2022-04-23 ENCOUNTER — Telehealth: Payer: 59 | Admitting: Licensed Clinical Social Worker

## 2022-04-23 ENCOUNTER — Telehealth: Payer: Self-pay | Admitting: Licensed Clinical Social Worker

## 2022-04-23 NOTE — Telephone Encounter (Signed)
  Care Coordination Clinical Social Work Note   04/23/22     Name: Michelle Tate           MRN: [id number]       DOB: 28-May-1996   [name] is a 26 y.o. year old female who is a primary care patient of Donell Beers, FNP and is actively engaged with the care management team.  LCSW called client phone number 4 separate times on 04/23/22.  LCSW was not able to communicate via phone with client today.  This represents 3 times calls have been set up to do initial outreach by LCSW to client and client has not been reachable via phone.  Follow Up Plan:  LCSW to recommend to Care Guide Scheduler that Care Guide Scheduler send note via Epic to PCP to inform of staff inability to reach client for care support.  Recommend that PCP talk more with client about client interest in Care support services  Kelton Pillar.Tip Atienza MSW, LCSW Licensed Visual merchandiser [facility] Care Management [phone]

## 2022-05-27 ENCOUNTER — Encounter: Payer: Self-pay | Admitting: Nurse Practitioner

## 2022-05-27 ENCOUNTER — Ambulatory Visit (INDEPENDENT_AMBULATORY_CARE_PROVIDER_SITE_OTHER): Payer: 59 | Admitting: Nurse Practitioner

## 2022-05-27 VITALS — BP 171/103 | HR 95 | Temp 98.1°F | Ht 67.0 in | Wt 369.0 lb

## 2022-05-27 DIAGNOSIS — E118 Type 2 diabetes mellitus with unspecified complications: Secondary | ICD-10-CM

## 2022-05-27 DIAGNOSIS — N926 Irregular menstruation, unspecified: Secondary | ICD-10-CM | POA: Diagnosis not present

## 2022-05-27 DIAGNOSIS — R051 Acute cough: Secondary | ICD-10-CM

## 2022-05-27 DIAGNOSIS — J029 Acute pharyngitis, unspecified: Secondary | ICD-10-CM

## 2022-05-27 DIAGNOSIS — I10 Essential (primary) hypertension: Secondary | ICD-10-CM

## 2022-05-27 DIAGNOSIS — R0981 Nasal congestion: Secondary | ICD-10-CM | POA: Diagnosis not present

## 2022-05-27 HISTORY — DX: Irregular menstruation, unspecified: N92.6

## 2022-05-27 HISTORY — DX: Nasal congestion: R09.81

## 2022-05-27 HISTORY — DX: Acute cough: R05.1

## 2022-05-27 LAB — POCT INFLUENZA A/B
Influenza A, POC: NEGATIVE
Influenza B, POC: NEGATIVE

## 2022-05-27 LAB — POCT RAPID STREP A (OFFICE): Rapid Strep A Screen: NEGATIVE

## 2022-05-27 MED ORDER — FLUTICASONE PROPIONATE 50 MCG/ACT NA SUSP: 2.0000 | Freq: Every day | NASAL | 6 refills | Status: AC

## 2022-05-27 MED ORDER — BENZONATATE 100 MG PO CAPS: 100.0000 mg | ORAL_CAPSULE | Freq: Two times a day (BID) | ORAL | 0 refills | Status: DC | PRN

## 2022-05-27 NOTE — Progress Notes (Signed)
Negative test, drink warm fluids, take tylenol as needed

## 2022-05-27 NOTE — Patient Instructions (Addendum)
Please get your fasting labs done tomorrow as planned   Flonase nasal spray ,2 spray into each nostril daily for stuffy nose take tylenol 650mg  every 6 hours as needed for fever /Headache.   Tessalon 100mg  two times daily as needed for cough   Goal for fasting blood sugar ranges from 80 to 120 and 2 hours after any meal or at bedtime should be between 130 to 170.  It is important that you exercise regularly at least 30 minutes 5 times a week.  Think about what you will eat, plan ahead. Choose " clean, green, fresh or frozen" over canned, processed or packaged foods which are more sugary, salty and fatty. 70 to 75% of food eaten should be vegetables and fruit. Three meals at set times with snacks allowed between meals, but they must be fruit or vegetables. Aim to eat over a 12 hour period , example 7 am to 7 pm, and STOP after  your last meal of the day. Drink water,generally about 64 ounces per day, no other drink is as healthy. Fruit juice is best enjoyed in a healthy way, by EATING the fruit.  Thanks for choosing [facility], we consider it a privelige to serve you.

## 2022-05-27 NOTE — Progress Notes (Signed)
Michelle Tate     MRN: [id number]      DOB: 23-Oct-1995   HPI Michelle Tate with past medical history of essential hypertension, asthma, seasonal allergic rhinitis, type 2 diabetes with complication, obesity is here for complaints of cough with clear sputum, wheezing, stuffy nose that started yesterday.  Stated that she has sore throat that started 3 days ago.  She denies known sick contacts.  Had home COVID test done test was negative.  She denies fever, bloody sputum, shortness of breath.  Has body aches and chills.  States that she received the COVID vaccines.  He has been using her albuterol inhaler as needed for wheezing   Type 2 diabetes.  Currently not on medication she missed her last appointment and has not had a chance to return to the office for follow-up.  She denies hypoglycemia reports urinary frequency  Hypertension.  Currently on amlodipine 5 mg daily.  Stated that she has not taken her medications today.  She denies dizziness, chest pain, edema.  Patient stated that her  menstruation period is  late by 7 days, she is sexually active not on birth control.  She has done home pregnancy test which was negative.  She refused urine pregnancy test today.    ROS  Denies sinus pressure,  Denies chest pains, palpitations and leg swelling Denies abdominal pain, nausea, vomiting,diarrhea or constipation.   Denies dysuria, hesitancy or incontinence. Denies joint pain, swelling and limitation in mobility. Denies headaches, seizures, numbness, or tingling. Denies depression, anxiety or insomnia. Denies skin break down or rash.   PE  BP (!) 171/103 (BP Location: Left Arm, Cuff Size: Large)   Pulse 95   Temp 98.1 F (36.7 C)   Ht 5\' 7"  (1.702 m)   Wt (!) 369 lb (167.4 kg)   SpO2 97%   BMI 57.79 kg/m   Patient alert and oriented and in no cardiopulmonary distress.  HEENT: No facial asymmetry, EOMI,     Neck supple . Enlarged nasal turbinates, clear nasal drainage noted, oral  mucosa moist and pink, tonsils moist and pink pharynx moist and pink.  No erythema or drainage noted.   Chest: Clear to auscultation bilaterally.  CVS: S1, S2 no murmurs, no S3.Regular rate.  ABD: Soft non tender.   Ext: No edema  MS: Adequate ROM spine, shoulders, hips and knees.  Psych: Good eye contact, normal affect. Memory intact not anxious or depressed appearing.  CNS: CN 2-12 intact, power,  normal throughout.no focal deficits noted.   Assessment & Plan  Essential hypertension BP Readings from Last 3 Encounters:  05/27/22 (!) 171/103  11/27/21 138/82  09/21/21 129/84  Currently on amlodipine 5 mg daily She reports that she has not taking her blood pressure medications today DASH diet advised, need to take medication daily discussed engage in regular daily exercises at least 150 minutes weekly Follow-up in 4 weeks  Controlled diabetes mellitus type 2 with complications (HCC) Lab Results  Component Value Date   HGBA1C 6.0 (A) 09/21/2021  Not on medication, stated that Ozempic was no more covered by her insurance  but she did not reach out to 14/02/2021 about this. Has polyuria Check A1c, lipid panel  today, will plan to get her restarted on a GLP1 once lab result is back , will check urine creatinine albumin labs at next visit, foot exam at next visit Currently not on ACE, statin Due for diabetic eye exam referral sent today Avoid sugar sweets soda  Sore  throat Strep test negative, flu negative Patient encouraged to drink warm fluids take Tylenol 650 mg every 6 hours as needed for sore throat, fever COVID test pending  Acute cough Flu negative COVID test pending Take Tessalon 100 mg twice daily as needed Tylenol 650 mg as needed and headache  Nasal congestion Start Flonase nasal spray 2 spray into each nostril at daily Continue albuterol inhaler as needed for wheezing  Late menses Sexually active, not on birth control She refused urine pregnancy test today

## 2022-05-27 NOTE — Assessment & Plan Note (Addendum)
Lab Results  Component Value Date   HGBA1C 6.0 (A) 09/21/2021  Not on medication, stated that Ozempic was no more covered by her insurance  but she did not reach out to [place] about this. Has polyuria Check A1c, lipid panel  today, will plan to get her restarted on a GLP1 once lab result is back , will check urine creatinine albumin labs at next visit, foot exam at next visit Currently not on ACE, statin Due for diabetic eye exam referral sent today Avoid sugar sweets soda

## 2022-05-27 NOTE — Assessment & Plan Note (Signed)
Flu negative COVID test pending Take Tessalon 100 mg twice daily as needed Tylenol 650 mg as needed and headache

## 2022-05-27 NOTE — Assessment & Plan Note (Signed)
Sexually active, not on birth control She refused urine pregnancy test today

## 2022-05-27 NOTE — Assessment & Plan Note (Signed)
Strep test negative, flu negative Patient encouraged to drink warm fluids take Tylenol 650 mg every 6 hours as needed for sore throat, fever COVID test pending

## 2022-05-27 NOTE — Assessment & Plan Note (Signed)
Start Flonase nasal spray 2 spray into each nostril at daily Continue albuterol inhaler as needed for wheezing

## 2022-05-27 NOTE — Assessment & Plan Note (Signed)
BP Readings from Last 3 Encounters:  05/27/22 (!) 171/103  11/27/21 138/82  09/21/21 129/84  Currently on amlodipine 5 mg daily She reports that she has not taking her blood pressure medications today DASH diet advised, need to take medication daily discussed engage in regular daily exercises at least 150 minutes weekly Follow-up in 4 weeks

## 2022-05-28 ENCOUNTER — Encounter: Payer: Self-pay | Admitting: Nurse Practitioner

## 2022-05-29 LAB — CMP14+EGFR
ALT: 14 IU/L (ref 0–32)
AST: 17 IU/L (ref 0–40)
Albumin/Globulin Ratio: 1.3 (ref 1.2–2.2)
Albumin: 4.2 g/dL (ref 4.0–5.0)
Alkaline Phosphatase: 105 IU/L (ref 44–121)
BUN/Creatinine Ratio: 7 — ABNORMAL LOW (ref 9–23)
BUN: 5 mg/dL — ABNORMAL LOW (ref 6–20)
Bilirubin Total: 0.4 mg/dL (ref 0.0–1.2)
CO2: 25 mmol/L (ref 20–29)
Calcium: 9.5 mg/dL (ref 8.7–10.2)
Chloride: 97 mmol/L (ref 96–106)
Creatinine, Ser: 0.71 mg/dL (ref 0.57–1.00)
Globulin, Total: 3.3 g/dL (ref 1.5–4.5)
Glucose: 155 mg/dL — ABNORMAL HIGH (ref 70–99)
Potassium: 4.7 mmol/L (ref 3.5–5.2)
Sodium: 139 mmol/L (ref 134–144)
Total Protein: 7.5 g/dL (ref 6.0–8.5)
eGFR: 119 mL/min/{1.73_m2} (ref >59)

## 2022-05-29 LAB — NOVEL CORONAVIRUS, NAA: SARS-CoV-2, NAA: NOT DETECTED

## 2022-05-29 LAB — LIPID PANEL
Chol/HDL Ratio: 3.8 ratio (ref 0.0–4.4)
Cholesterol, Total: 157 mg/dL (ref 100–199)
HDL: 41 mg/dL (ref >39)
LDL Chol Calc (NIH): 95 mg/dL (ref 0–99)
Triglycerides: 116 mg/dL (ref 0–149)
VLDL Cholesterol Cal: 21 mg/dL (ref 5–40)

## 2022-05-29 LAB — HEMOGLOBIN A1C
Est. average glucose Bld gHb Est-mCnc: 166 mg/dL
Hgb A1c MFr Bld: 7.4 % — ABNORMAL HIGH (ref 4.8–5.6)

## 2022-05-29 NOTE — Progress Notes (Signed)
A1C uncontrolled at 7.4 , we can try ozempic again for her uncontrolled DM, this med will also  assist with weight loss, please discuss this with patient .Thanks

## 2022-06-02 ENCOUNTER — Telehealth: Payer: Self-pay | Admitting: Adult Health

## 2022-06-02 NOTE — Telephone Encounter (Signed)
Mother stated her daughter was at work and she was calling for her. She states daughter hasn't had period in awhile, has taken several pregnancy test with negative result, and has been nauseated. Can we get orders to check her hcg levels? She wants to go tomorrow. Please advise.

## 2022-06-03 ENCOUNTER — Other Ambulatory Visit: Payer: 59

## 2022-06-03 ENCOUNTER — Other Ambulatory Visit: Payer: Self-pay | Admitting: Nurse Practitioner

## 2022-06-03 DIAGNOSIS — N926 Irregular menstruation, unspecified: Secondary | ICD-10-CM

## 2022-06-03 DIAGNOSIS — E118 Type 2 diabetes mellitus with unspecified complications: Secondary | ICD-10-CM

## 2022-06-03 MED ORDER — SEMAGLUTIDE(0.25 OR 0.5MG/DOS) 2 MG/3ML ~~LOC~~ SOPN: 0.2500 mg | PEN_INJECTOR | SUBCUTANEOUS | 0 refills | Status: DC

## 2022-06-04 LAB — BETA HCG QUANT (REF LAB): hCG Quant: <1 m[IU]/mL

## 2022-06-09 ENCOUNTER — Encounter (HOSPITAL_COMMUNITY): Payer: Self-pay | Admitting: Emergency Medicine

## 2022-06-09 ENCOUNTER — Other Ambulatory Visit: Payer: Self-pay

## 2022-06-09 ENCOUNTER — Emergency Department (HOSPITAL_COMMUNITY)
Admission: EM | Admit: 2022-06-09 | Discharge: 2022-06-09 | Disposition: A | Discharge status: Home / Self Care | Payer: 59 | Attending: Emergency Medicine | Admitting: Emergency Medicine

## 2022-06-09 ENCOUNTER — Emergency Department (HOSPITAL_COMMUNITY): Payer: 59

## 2022-06-09 DIAGNOSIS — W010XXA Fall on same level from slipping, tripping and stumbling without subsequent striking against object, initial encounter: Secondary | ICD-10-CM | POA: Diagnosis not present

## 2022-06-09 DIAGNOSIS — M25511 Pain in right shoulder: Secondary | ICD-10-CM | POA: Insufficient documentation

## 2022-06-09 DIAGNOSIS — Z91010 Allergy to peanuts: Secondary | ICD-10-CM | POA: Diagnosis not present

## 2022-06-09 DIAGNOSIS — M62838 Other muscle spasm: Secondary | ICD-10-CM | POA: Insufficient documentation

## 2022-06-09 LAB — CBG MONITORING, ED: Glucose-Capillary: 249 mg/dL — ABNORMAL HIGH (ref 70–99)

## 2022-06-09 MED ORDER — METHOCARBAMOL 500 MG PO TABS
500.0000 mg | ORAL_TABLET | Freq: Once | ORAL | Status: AC
  Administered 2022-06-09: 500 mg via ORAL
  Filled 2022-06-09: qty 1

## 2022-06-09 MED ORDER — IBUPROFEN 800 MG PO TABS: 800.0000 mg | ORAL_TABLET | Freq: Three times a day (TID) | ORAL | 0 refills | Status: DC

## 2022-06-09 MED ORDER — METHOCARBAMOL 500 MG PO TABS: 500.0000 mg | ORAL_TABLET | Freq: Two times a day (BID) | ORAL | 0 refills | Status: DC

## 2022-06-09 MED ORDER — IBUPROFEN 800 MG PO TABS
800.0000 mg | ORAL_TABLET | Freq: Once | ORAL | Status: AC
  Administered 2022-06-09: 800 mg via ORAL
  Filled 2022-06-09: qty 1

## 2022-06-09 NOTE — ED Provider Notes (Signed)
[facility] EMERGENCY DEPARTMENT Provider Note   CSN: [id number] Arrival date & time: 06/09/22  1731     History  Chief Complaint  Patient presents with   Shoulder Pain    Michelle Tate is a 26 y.o. female.  Patient is a 26 yo female presenting for right shoulder pain after fall. Pt admits to tripping and falling this morning and landing on her right side. Denies head trauma. Denies blood thinner use. Admits to tingling sensation in arm. Denies motor dysfunction.   The history is provided by the patient. No language interpreter was used.  Shoulder Pain Associated symptoms: no fever        Home Medications Prior to Admission medications   Medication Sig Start Date End Date Taking? Authorizing Provider  ibuprofen (ADVIL) 800 MG tablet Take 1 tablet (800 mg total) by mouth 3 (three) times daily. 06/09/22  Yes Edwin Dada P, DO  methocarbamol (ROBAXIN) 500 MG tablet Take 1 tablet (500 mg total) by mouth 2 (two) times daily. 06/09/22  Yes Edwin Dada P, DO  acetaminophen (TYLENOL) 325 MG tablet Take 325 mg by mouth every 6 (six) hours as needed.     [provider]  albuterol (VENTOLIN HFA) 108 (90 Base) MCG/ACT inhaler Inhale 2 puffs into the lungs every 4 (four) hours as needed for wheezing or shortness of breath. 06/12/20   Ladona Ridgel, Malena M, DO  amLODipine (NORVASC) 5 MG tablet Take 1 tablet (5 mg total) by mouth daily. 09/21/21   Paseda, Baird Kay, FNP  benzonatate (TESSALON) 100 MG capsule Take 1 capsule (100 mg total) by mouth 2 (two) times daily as needed for cough. 05/27/22   Donell Beers, FNP  EPINEPHrine 0.3 mg/0.3 mL IJ SOAJ injection Inject 0.3 mLs (0.3 mg total) into the muscle once as needed for anaphylaxis. 05/16/20   Alfonse Spruce, MD  escitalopram (LEXAPRO) 10 MG tablet Take 1 tablet (10 mg total) by mouth daily. Patient not taking: Reported on 05/27/2022 11/27/21   Donell Beers, FNP  fluticasone (FLONASE) 50 MCG/ACT nasal spray Place 2  sprays into both nostrils daily. 05/27/22   Paseda, Baird Kay, FNP  loratadine (CLARITIN) 10 MG tablet Take 1 tablet (10 mg total) by mouth daily. 02/09/21   Novella Olive, NP  Semaglutide,0.25 or 0.5MG /DOS, 2 MG/3ML SOPN Inject 0.25 mg into the skin once a week. 06/03/22   Donell Beers, FNP  Spacer/Aero-Holding Chambers (AEROCHAMBER MINI CHAMBER) DEVI 1 Units by Does not apply route daily. Use with inhaler 06/12/20   Melene Plan, MD      Allergies    Banana, Peanut-containing drug products, Penicillins, Sulfa drugs cross reactors, and Vanilla    Review of Systems   Review of Systems  Constitutional:  Negative for chills and fever.  Skin:  Negative for color change and wound.  Neurological:  Positive for numbness. Negative for weakness.    Physical Exam Updated Vital Signs BP 136/69 (BP Location: Right Arm)   Pulse (!) 103   Temp 98.6 F (37 C) (Oral)   Resp (!) 23   Ht 5\' 7"  (1.702 m)   LMP 06/09/2022   SpO2 96%   BMI 57.79 kg/m  Physical Exam Vitals and nursing note reviewed.  Constitutional:      Appearance: Normal appearance.  HENT:     Head: Normocephalic and atraumatic.  Cardiovascular:     Rate and Rhythm: Normal rate.     Pulses:  Radial pulses are 2+ on the right side.  Pulmonary:     Effort: Pulmonary effort is normal. No respiratory distress.  Musculoskeletal:     Right shoulder: Bony tenderness present.     Right upper arm: Normal.     Right elbow: Normal.  Skin:    Capillary Refill: Capillary refill takes less than 2 seconds.  Neurological:     General: No focal deficit present.     Mental Status: She is alert and oriented to person, place, and time.     GCS: GCS eye subscore is 4. GCS verbal subscore is 5. GCS motor subscore is 6.     Sensory: Sensation is intact.     Motor: Motor function is intact.     ED Results / Procedures / Treatments   Labs (all labs ordered are listed, but only abnormal results are displayed) Labs  Reviewed  CBG MONITORING, ED - Abnormal; Notable for the following components:      Result Value   Glucose-Capillary 249 (*)    All other components within normal limits    EKG None  Radiology DG Shoulder Right  Result Date: 06/09/2022 CLINICAL DATA:  Fall EXAM: RIGHT SHOULDER - 2+ VIEW COMPARISON:  None Available. FINDINGS: There is no evidence of fracture or dislocation. There is no evidence of arthropathy or other focal bone abnormality. Soft tissues are unremarkable. IMPRESSION: Negative. Electronically Signed   By: Charlett Nose M.D.   On: 06/09/2022 18:49    Procedures Procedures    Medications Ordered in ED Medications  ibuprofen (ADVIL) tablet 800 mg (has no administration in time range)  methocarbamol (ROBAXIN) tablet 500 mg (has no administration in time range)    ED Course/ Medical Decision Making/ A&P                           Medical Decision Making Amount and/or Complexity of Data Reviewed Radiology: ordered.   8:11 PM  26 yo female presenting for right shoulder pain after fall. Patient is Aox3, no acute distress, stable vitals. Physical exam demonstrates tenderness over the posterior AC region. No motor dysfunction. Sensation intact. Pulses normal. Xray demonstrates no fractures. Pt does have muscle spasm at distal trapezius muscle on the right. Neurovascularly intact. Soft compartments.   Recommended for motrin and robaxin.   Patient in no distress and overall condition improved here in the ED. Detailed discussions were had with the patient regarding current findings, and need for close f/u with PCP or on call doctor. The patient has been instructed to return immediately if the symptoms worsen in any way for re-evaluation. Patient verbalized understanding and is in agreement with current care plan. All questions answered prior to discharge.         Final Clinical Impression(s) / ED Diagnoses Final diagnoses:  Acute pain of right shoulder  Muscle spasm     Rx / DC Orders ED Discharge Orders          Ordered    methocarbamol (ROBAXIN) 500 MG tablet  2 times daily        06/09/22 2010    ibuprofen (ADVIL) 800 MG tablet  3 times daily        06/09/22 2010              Franne Forts, DO 06/09/22 2011

## 2022-06-09 NOTE — ED Triage Notes (Addendum)
Pt tripped and fell and c/o right shoulder pain. Hit head but no LOC per pt and witness. Nad. Pt obese but no obvious deformity noted. Radial pulses present. Pain with palpation. Rom limited due to pain

## 2022-07-01 ENCOUNTER — Telehealth: Payer: Self-pay

## 2022-07-01 NOTE — Telephone Encounter (Signed)
Called pt to see if she will be willing to try metformin again no answer vm not set up

## 2022-07-07 ENCOUNTER — Encounter: Payer: Self-pay | Admitting: Nurse Practitioner

## 2022-07-07 ENCOUNTER — Ambulatory Visit (INDEPENDENT_AMBULATORY_CARE_PROVIDER_SITE_OTHER): Payer: 59 | Admitting: Nurse Practitioner

## 2022-07-07 VITALS — BP 156/114 | HR 96 | Resp 16 | Ht 68.0 in | Wt 381.0 lb

## 2022-07-07 DIAGNOSIS — Z0001 Encounter for general adult medical examination with abnormal findings: Secondary | ICD-10-CM | POA: Diagnosis not present

## 2022-07-07 DIAGNOSIS — Z0000 Encounter for general adult medical examination without abnormal findings: Secondary | ICD-10-CM | POA: Diagnosis not present

## 2022-07-07 DIAGNOSIS — I10 Essential (primary) hypertension: Secondary | ICD-10-CM | POA: Diagnosis not present

## 2022-07-07 DIAGNOSIS — Z23 Encounter for immunization: Secondary | ICD-10-CM | POA: Diagnosis not present

## 2022-07-07 DIAGNOSIS — E118 Type 2 diabetes mellitus with unspecified complications: Secondary | ICD-10-CM | POA: Diagnosis not present

## 2022-07-07 DIAGNOSIS — E1169 Type 2 diabetes mellitus with other specified complication: Secondary | ICD-10-CM | POA: Diagnosis not present

## 2022-07-07 HISTORY — DX: Encounter for general adult medical examination without abnormal findings: Z00.00

## 2022-07-07 MED ORDER — UNABLE TO FIND
0 refills | Status: DC
Start: 2022-07-07 — End: 2022-07-15

## 2022-07-07 MED ORDER — LOSARTAN POTASSIUM 25 MG PO TABS: 25.0000 mg | ORAL_TABLET | Freq: Every day | ORAL | 0 refills | Status: AC

## 2022-07-07 MED ORDER — METFORMIN HCL 1000 MG PO TABS: 1000.0000 mg | ORAL_TABLET | Freq: Two times a day (BID) | ORAL | 3 refills | Status: AC

## 2022-07-07 MED ORDER — AMLODIPINE BESYLATE 5 MG PO TABS: 5.0000 mg | ORAL_TABLET | Freq: Every day | ORAL | 6 refills | Status: AC

## 2022-07-07 NOTE — Assessment & Plan Note (Signed)
Lab Results  Component Value Date   HGBA1C 7.4 (H) 05/28/2022  Currently on metformin 500 mg twice daily Start metformin 1000 mg twice daily She could not afford cost of Ozempic referral sent to the pharmacist for medication assistance, this will aslo assist with weight loss Has upcoming diabetic eye exam Avoid sugar sweets soda Engage in regular moderate to vigorous exercises to lose weight Urine microalbumin creatinine labs today Diabetic foot exam completed today

## 2022-07-07 NOTE — Assessment & Plan Note (Signed)
Patient educated on CDC recommendation for the influenza vaccine. Verbal consent was obtained from the patient, vaccine administered by nurse, no sign of adverse reactions noted at this time. Patient education on arm soreness and use of tylenol  for this patient  was discussed. Patient educated on the signs and symptoms of adverse effect and advise to contact the office if they occur. Vaccine information sheet given to patient.  
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
## 2022-07-07 NOTE — Assessment & Plan Note (Signed)
Annual exam as documented.  Counseling done include healthy lifestyle involving committing to 150 minutes of exercise per week, heart healthy diet, and attaining healthy weight. The importance of adequate sleep also discussed.  Regular use of seat belt and home safety were also discussed . Changes in health habits are decided on by patient with goals and time frames set for achieving them. Immunization and cancer screening  needs are specifically addressed at this visit.  Flu vaccine given today ,  follow up with [facility] for PAP

## 2022-07-07 NOTE — Assessment & Plan Note (Signed)
BP Readings from Last 3 Encounters:  07/07/22 (!) 156/114  06/09/22 134/68  05/27/22 (!) 171/103  Chronic condition uncontrolled Currently on amlodipine 5 mg daily, start losartan 25 mg daily continue current dose of amlodipine 5 mg daily. DASH diet advised patient encouraged to engage in regular moderate to vigorous exercises at least 150 minutes weekly Monitor blood pressure  at home keep a log and bring to next visit in 4 weeks. BP goal is less than 130/80

## 2022-07-07 NOTE — Patient Instructions (Signed)
Please start taking losartan 25mg  daily , continue amlodipine 5mg  daily . Blood pressure goal is less than 130/80   Start taking metformin 1000 mg twice daily. Goal for fasting blood sugar ranges from 80 to 120 and 2 hours after any meal or at bedtime should be between 130 to 170.       Around 3 times per week, check your blood pressure 2 times per day. Once  in the morning and once in the evening. The readings should be at least one minute apart. Write down these values and bring them to your next nurse visit/appointment.  When you check your BP, make sure you have been doing something calm/relaxing 5 minutes prior to checking. Both feet should be flat on the floor and you should be sitting. Use your left arm and make sure it is in a relaxed position (on a table), and that the cuff is at the approximate level/height of your heart.

## 2022-07-07 NOTE — Assessment & Plan Note (Addendum)
Wt Readings from Last 3 Encounters:  07/07/22 (!) 381 lb (172.8 kg)  05/27/22 (!) 369 lb (167.4 kg)  11/27/21 (!) 333 lb 1.3 oz (151.1 kg)  She has not been exercising due to the nature of her job, states that she could do better with her diet. Need to increase intake of whole food consisting mainly vegetables and protein less carbohydrate drinking at least 64 ounces of water daily engaging in regular moderate to vigorous exercises at least 150 minutes weekly, import portion control also discussed. Patient will benefit from weight loss surgery she was referred to the medical weight management clinic today.  She was started on Ozempic but due to cost of co-pay she has not been able to start using the medication, referral sent to the pharmacist for  Cost of medication  assistance

## 2022-07-08 ENCOUNTER — Telehealth: Payer: Self-pay

## 2022-07-08 ENCOUNTER — Other Ambulatory Visit: Payer: Self-pay | Admitting: Nurse Practitioner

## 2022-07-08 DIAGNOSIS — E559 Vitamin D deficiency, unspecified: Secondary | ICD-10-CM

## 2022-07-08 MED ORDER — VITAMIN D (ERGOCALCIFEROL) 1.25 MG (50000 UNIT) PO CAPS: 50000.0000 [IU] | ORAL_CAPSULE | ORAL | 0 refills | Status: DC

## 2022-07-08 NOTE — Telephone Encounter (Signed)
I will not be here this pm if someone else needs to be sent the message

## 2022-07-08 NOTE — Telephone Encounter (Signed)
Patient returning call. Best time to call patient back is lunch at 2:00 pm.

## 2022-07-08 NOTE — Telephone Encounter (Signed)
LMTRC

## 2022-07-08 NOTE — Progress Notes (Signed)
Vitamin D level is low.  Start taking vitamin D 50,000 units once weekly.  Awaiting urine test  Other labs are normal

## 2022-07-08 NOTE — Telephone Encounter (Signed)
Called pt no answer left vm 

## 2022-07-09 LAB — CBC WITH DIFFERENTIAL/PLATELET
Basophils Absolute: 0 10*3/uL (ref 0.0–0.2)
Basos: 0 %
EOS (ABSOLUTE): 0.2 10*3/uL (ref 0.0–0.4)
Eos: 2 %
Hematocrit: 42 % (ref 34.0–46.6)
Hemoglobin: 13.7 g/dL (ref 11.1–15.9)
Immature Grans (Abs): 0 10*3/uL (ref 0.0–0.1)
Immature Granulocytes: 0 %
Lymphocytes Absolute: 3.1 10*3/uL (ref 0.7–3.1)
Lymphs: 35 %
MCH: 26.7 pg (ref 26.6–33.0)
MCHC: 32.6 g/dL (ref 31.5–35.7)
MCV: 82 fL (ref 79–97)
Monocytes Absolute: 0.6 10*3/uL (ref 0.1–0.9)
Monocytes: 7 %
Neutrophils Absolute: 4.8 10*3/uL (ref 1.4–7.0)
Neutrophils: 56 %
Platelets: 339 10*3/uL (ref 150–450)
RBC: 5.13 x10E6/uL (ref 3.77–5.28)
RDW: 13 % (ref 11.7–15.4)
WBC: 8.7 10*3/uL (ref 3.4–10.8)

## 2022-07-09 LAB — MICROALBUMIN / CREATININE URINE RATIO
Creatinine, Urine: 102.5 mg/dL
Microalb/Creat Ratio: 9 mg/g creat (ref 0–29)
Microalbumin, Urine: 9.3 ug/mL

## 2022-07-09 LAB — HEPATITIS C ANTIBODY: Hep C Virus Ab: NONREACTIVE

## 2022-07-09 LAB — TSH: TSH: 2.7 u[IU]/mL (ref 0.450–4.500)

## 2022-07-09 LAB — VITAMIN D 25 HYDROXY (VIT D DEFICIENCY, FRACTURES): Vit D, 25-Hydroxy: 7.6 ng/mL — ABNORMAL LOW (ref 30.0–100.0)

## 2022-07-09 NOTE — Telephone Encounter (Signed)
Called pt no answer left vm 

## 2022-07-12 ENCOUNTER — Encounter: Payer: Self-pay | Admitting: Family Medicine

## 2022-07-13 ENCOUNTER — Other Ambulatory Visit: Payer: Self-pay | Admitting: Family Medicine

## 2022-07-13 NOTE — Telephone Encounter (Signed)
Can you please ask if she is okay with attributing her frequent urination to diabetes in the letter

## 2022-07-14 ENCOUNTER — Other Ambulatory Visit (HOSPITAL_COMMUNITY): Payer: Self-pay

## 2022-07-14 ENCOUNTER — Encounter: Payer: Self-pay | Admitting: Family Medicine

## 2022-07-15 ENCOUNTER — Other Ambulatory Visit (HOSPITAL_COMMUNITY): Payer: Self-pay

## 2022-07-15 ENCOUNTER — Ambulatory Visit: Payer: 59 | Admitting: Adult Health

## 2022-07-15 ENCOUNTER — Other Ambulatory Visit (HOSPITAL_COMMUNITY)
Admission: RE | Admit: 2022-07-15 | Discharge: 2022-07-15 | Disposition: A | Discharge status: Home / Self Care | Payer: 59 | Source: Ambulatory Visit | Attending: Adult Health | Admitting: Adult Health

## 2022-07-15 ENCOUNTER — Encounter: Payer: Self-pay | Admitting: Adult Health

## 2022-07-15 VITALS — BP 152/101 | HR 96 | Ht 68.0 in | Wt 373.6 lb

## 2022-07-15 DIAGNOSIS — Z3202 Encounter for pregnancy test, result negative: Secondary | ICD-10-CM | POA: Diagnosis not present

## 2022-07-15 DIAGNOSIS — Z319 Encounter for procreative management, unspecified: Secondary | ICD-10-CM | POA: Diagnosis not present

## 2022-07-15 DIAGNOSIS — I10 Essential (primary) hypertension: Secondary | ICD-10-CM | POA: Diagnosis not present

## 2022-07-15 DIAGNOSIS — N938 Other specified abnormal uterine and vaginal bleeding: Secondary | ICD-10-CM | POA: Diagnosis not present

## 2022-07-15 DIAGNOSIS — N760 Acute vaginitis: Secondary | ICD-10-CM | POA: Diagnosis not present

## 2022-07-15 HISTORY — DX: Encounter for pregnancy test, result negative: Z32.02

## 2022-07-15 HISTORY — DX: Encounter for procreative management, unspecified: Z31.9

## 2022-07-15 LAB — POCT URINE PREGNANCY: Preg Test, Ur: NEGATIVE

## 2022-07-15 MED ORDER — MEGESTROL ACETATE 40 MG PO TABS: ORAL_TABLET | ORAL | 0 refills | Status: AC

## 2022-07-15 NOTE — Progress Notes (Signed)
  Subjective:     Patient ID: Michelle Tate, female   DOB: [birth year], 26 y.o.   MRN: [id number]  HPI Michelle Tate is a 26 year old black female,single, G0P0, in complaining of bleeding since 06/28/22, heavy at times. Had +HPT 06/13/22.  Last pap was 04/13/21, negative HPV, ASCUS  PCP is Michelle Monday NP  Review of Systems Bleeding since 06/28/22 heavy at times with small clots Had +HPT 06/13/22 Reviewed past medical,surgical, social and family history. Reviewed medications and allergies.     Objective:   Physical Exam BP (!) 152/101 (BP Location: Right Arm, Patient Position: Sitting, Cuff Size: Normal)   Pulse 96   Ht 5\' 8"  (1.727 m)   Wt (!) 373 lb 9.6 oz (169.5 kg)   LMP 06/28/2022   BMI 56.81 kg/m  UPT is negative  Skin warm and dry.Pelvic: external genitalia is normal in appearance no lesions, vagina: +period blood,urethra has no lesions or masses noted, cervix:smooth,no CMT, uterus: normal size, shape and contour, mildly tender, no masses felt, adnexa: no masses or tenderness noted. Bladder is non tender and no masses felt. CV swab obtained.  [facility] - 07/15/22 1108       Pregnancy Intention Screening   Does the patient want to become pregnant in the next year? Yes    Does the patient's partner want to become pregnant in the next year? Yes    Would the patient like to discuss contraceptive options today? No      Contraception Wrap Up   Current Method Pregnant/Seeking Pregnancy    End Method Pregnant/Seeking Pregnancy               Examination chaperoned by Levy Pupa LPN  Assessment:     1. Pregnancy examination or test, negative result - POCT urine pregnancy  2. DUB (dysfunctional uterine bleeding) Has been bleeding since 06/28/22, can be heavy at times  CV swab sent for GC/CHL,trich,BV and yeast Will check labs - Cervicovaginal ancillary only( [place]) - CBC - Comprehensive metabolic panel - TSH Will rx megace to stop bleeding  Meds ordered this  encounter  Medications   megestrol (MEGACE) 40 MG tablet    Sig: Take 3 x 5 days then 2 x 5 days then 1 daily til bleeding stops    Dispense:  45 tablet    Refill:  0    Order Specific Question:   Supervising Provider    Answer:   Elonda Husky, LUTHER H [[id number]]    3. Essential hypertension Has not taken BP meds yet Go home and take meds   4. Morbid obesity ([place]) Try to lose some weight before trying to get pergnant   5. Patient desires pregnancy Take PNV     Plan:     Follow up in 4 weeks

## 2022-07-16 ENCOUNTER — Telehealth: Payer: Self-pay

## 2022-07-16 ENCOUNTER — Other Ambulatory Visit (HOSPITAL_COMMUNITY): Payer: Self-pay

## 2022-07-16 LAB — CBC
Hematocrit: 43.4 % (ref 34.0–46.6)
Hemoglobin: 13.4 g/dL (ref 11.1–15.9)
MCH: 25.4 pg — ABNORMAL LOW (ref 26.6–33.0)
MCHC: 30.9 g/dL — ABNORMAL LOW (ref 31.5–35.7)
MCV: 82 fL (ref 79–97)
Platelets: 379 10*3/uL (ref 150–450)
RBC: 5.28 x10E6/uL (ref 3.77–5.28)
RDW: 12.9 % (ref 11.7–15.4)
WBC: 8.6 10*3/uL (ref 3.4–10.8)

## 2022-07-16 LAB — COMPREHENSIVE METABOLIC PANEL
ALT: 26 IU/L (ref 0–32)
AST: 28 IU/L (ref 0–40)
Albumin/Globulin Ratio: 1.3 (ref 1.2–2.2)
Albumin: 4.3 g/dL (ref 4.0–5.0)
Alkaline Phosphatase: 108 IU/L (ref 44–121)
BUN/Creatinine Ratio: 14 (ref 9–23)
BUN: 9 mg/dL (ref 6–20)
Bilirubin Total: 0.2 mg/dL (ref 0.0–1.2)
CO2: 26 mmol/L (ref 20–29)
Calcium: 9.9 mg/dL (ref 8.7–10.2)
Chloride: 97 mmol/L (ref 96–106)
Creatinine, Ser: 0.66 mg/dL (ref 0.57–1.00)
Globulin, Total: 3.2 g/dL (ref 1.5–4.5)
Glucose: 195 mg/dL — ABNORMAL HIGH (ref 70–99)
Potassium: 5.3 mmol/L — ABNORMAL HIGH (ref 3.5–5.2)
Sodium: 137 mmol/L (ref 134–144)
Total Protein: 7.5 g/dL (ref 6.0–8.5)
eGFR: 122 mL/min/{1.73_m2} (ref >59)

## 2022-07-16 LAB — TSH: TSH: 1.94 u[IU]/mL (ref 0.450–4.500)

## 2022-07-16 LAB — CERVICOVAGINAL ANCILLARY ONLY
Bacterial Vaginitis (gardnerella): POSITIVE — AB
Candida Glabrata: NEGATIVE
Candida Vaginitis: NEGATIVE
Chlamydia: NEGATIVE
Comment: NEGATIVE
Comment: NEGATIVE
Comment: NEGATIVE
Comment: NEGATIVE
Comment: NEGATIVE
Comment: NORMAL
Neisseria Gonorrhea: NEGATIVE
Trichomonas: NEGATIVE

## 2022-07-16 NOTE — Telephone Encounter (Signed)
Prior Auth for patients medication OZEMPIC denied by [employer] via CoverMyMeds.   Reason:   CoverMyMeds Key: [id number]  Pt would not qualify for patient assistance with novo nordisk due to having commercial coverage.

## 2022-07-16 NOTE — Telephone Encounter (Signed)
A Prior Authorization was initiated for this patients OZEMPIC through CoverMyMeds.   Key: Michelle Tate

## 2022-07-19 ENCOUNTER — Other Ambulatory Visit: Payer: Self-pay | Admitting: Adult Health

## 2022-07-19 MED ORDER — METRONIDAZOLE 500 MG PO TABS: 500.0000 mg | ORAL_TABLET | Freq: Two times a day (BID) | ORAL | 0 refills | Status: DC

## 2022-08-04 ENCOUNTER — Ambulatory Visit (INDEPENDENT_AMBULATORY_CARE_PROVIDER_SITE_OTHER): Payer: 59 | Admitting: Family Medicine

## 2022-08-04 ENCOUNTER — Emergency Department
Admission: EM | Admit: 2022-08-04 | Discharge: 2022-08-04 | Disposition: A | Discharge status: Home / Self Care | Payer: 59 | Attending: Emergency Medicine | Admitting: Emergency Medicine

## 2022-08-04 ENCOUNTER — Other Ambulatory Visit: Payer: Self-pay

## 2022-08-04 ENCOUNTER — Emergency Department: Payer: 59

## 2022-08-04 ENCOUNTER — Encounter: Payer: Self-pay | Admitting: Family Medicine

## 2022-08-04 VITALS — BP 138/78 | HR 98 | Ht 68.0 in | Wt 374.0 lb

## 2022-08-04 DIAGNOSIS — E119 Type 2 diabetes mellitus without complications: Secondary | ICD-10-CM | POA: Insufficient documentation

## 2022-08-04 DIAGNOSIS — Y92410 Unspecified street and highway as the place of occurrence of the external cause: Secondary | ICD-10-CM | POA: Diagnosis not present

## 2022-08-04 DIAGNOSIS — E1169 Type 2 diabetes mellitus with other specified complication: Secondary | ICD-10-CM

## 2022-08-04 DIAGNOSIS — R110 Nausea: Secondary | ICD-10-CM | POA: Diagnosis not present

## 2022-08-04 DIAGNOSIS — M542 Cervicalgia: Secondary | ICD-10-CM | POA: Diagnosis not present

## 2022-08-04 DIAGNOSIS — S0990XA Unspecified injury of head, initial encounter: Secondary | ICD-10-CM | POA: Insufficient documentation

## 2022-08-04 DIAGNOSIS — M545 Low back pain, unspecified: Secondary | ICD-10-CM | POA: Insufficient documentation

## 2022-08-04 DIAGNOSIS — M7918 Myalgia, other site: Secondary | ICD-10-CM

## 2022-08-04 DIAGNOSIS — J45909 Unspecified asthma, uncomplicated: Secondary | ICD-10-CM | POA: Insufficient documentation

## 2022-08-04 DIAGNOSIS — I10 Essential (primary) hypertension: Secondary | ICD-10-CM | POA: Diagnosis not present

## 2022-08-04 DIAGNOSIS — R519 Headache, unspecified: Secondary | ICD-10-CM | POA: Diagnosis not present

## 2022-08-04 DIAGNOSIS — M791 Myalgia, unspecified site: Secondary | ICD-10-CM | POA: Insufficient documentation

## 2022-08-04 DIAGNOSIS — S199XXA Unspecified injury of neck, initial encounter: Secondary | ICD-10-CM | POA: Diagnosis not present

## 2022-08-04 DIAGNOSIS — M2578 Osteophyte, vertebrae: Secondary | ICD-10-CM | POA: Diagnosis not present

## 2022-08-04 DIAGNOSIS — S3992XA Unspecified injury of lower back, initial encounter: Secondary | ICD-10-CM | POA: Diagnosis not present

## 2022-08-04 DIAGNOSIS — E049 Nontoxic goiter, unspecified: Secondary | ICD-10-CM | POA: Diagnosis not present

## 2022-08-04 LAB — POC URINE PREG, ED: Preg Test, Ur: NEGATIVE

## 2022-08-04 MED ORDER — CYCLOBENZAPRINE HCL 10 MG PO TABS: 5.0000 mg | ORAL_TABLET | Freq: Three times a day (TID) | ORAL | 0 refills | Status: DC | PRN

## 2022-08-04 MED ORDER — IBUPROFEN 800 MG PO TABS: 800.0000 mg | ORAL_TABLET | Freq: Three times a day (TID) | ORAL | 0 refills | Status: DC | PRN

## 2022-08-04 MED ORDER — ONDANSETRON HCL 4 MG PO TABS: 4.0000 mg | ORAL_TABLET | Freq: Three times a day (TID) | ORAL | 0 refills | Status: AC | PRN
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
## 2022-08-04 NOTE — Assessment & Plan Note (Addendum)
Controlled Encouraged to continue taking amlodipine 5 mg daily and losartan 25 mg daily Encouraged to increase physical activities and adhere to diet diet We will follow up in 3 months BP Readings from Last 3 Encounters:  08/04/22 138/78  07/15/22 (!) 152/101  07/07/22 (!) 156/114
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
## 2022-08-04 NOTE — Patient Instructions (Addendum)
I appreciate the opportunity to provide care to you today!    Follow up:  3 months   Request a refill of your amlodipine 5 mg at the pharmacy  Please stop by your local pharmacy and get your Tdap and Shingles vaccine     Please continue to a heart-healthy diet and increase your physical activities. Try to exercise for 70mins at least three times a week.      It was a pleasure to see you and I look forward to continuing to work together on your health and well-being. Please do not hesitate to call the office if you need care or have questions about your care.   Have a wonderful day and week. With Gratitude, Alvira Monday MSN, FNP-BC
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
## 2022-08-04 NOTE — Assessment & Plan Note (Signed)
She takes metformin at 1000 mg twice daily She complains of GI effects of the medication noting occasional nausea and diarrhea Will order Zofran for nausea Encouraged to use over-the-counter Imodium for occasional diarrhea
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
## 2022-08-12 ENCOUNTER — Ambulatory Visit: Payer: 59 | Admitting: Adult Health

## 2022-08-18 ENCOUNTER — Ambulatory Visit: Payer: 59 | Admitting: Adult Health

## 2022-08-18 ENCOUNTER — Encounter: Payer: Self-pay | Admitting: Adult Health

## 2022-08-18 VITALS — BP 138/88 | HR 100 | Ht 67.0 in | Wt 374.8 lb

## 2022-08-18 DIAGNOSIS — N938 Other specified abnormal uterine and vaginal bleeding: Secondary | ICD-10-CM

## 2022-08-18 DIAGNOSIS — I10 Essential (primary) hypertension: Secondary | ICD-10-CM

## 2022-08-18 DIAGNOSIS — Z319 Encounter for procreative management, unspecified: Secondary | ICD-10-CM | POA: Diagnosis not present

## 2022-08-18 NOTE — Progress Notes (Signed)
  Subjective:     Patient ID: Michelle Tate, female   DOB: [birth year], 26 y.o.   MRN: [id number]  HPI Michelle Tate is a 25 year old black female, recently engaged, G0P0, back in follow up on taking megace for bleeding and the bleeding stopped.  Last pap was ASCUS negative HPV, 04/13/21.  PCP is Michelle Grain NP  Review of Systems Bleeding stopped with megace Reviewed past medical,surgical, social and family history. Reviewed medications and allergies.     Objective:   Physical Exam BP 138/88 (BP Location: Left Arm, Cuff Size: Normal)   Pulse 100   Ht 5\' 7"  (1.702 m)   Wt (!) 374 lb 12.8 oz (170 kg)   LMP 07/05/2022 Comment: takes Megace  BMI 58.70 kg/m     Skin warm and dry. Lungs: clear to ausculation bilaterally. Cardiovascular: regular rate and rhythm.   [place] - 08/18/22 0948       Pregnancy Intention Screening   Does the patient want to become pregnant in the next year? Yes    Does the patient's partner want to become pregnant in the next year? Yes    Would the patient like to discuss contraceptive options today? No      Contraception Wrap Up   Current Method Pregnant/Seeking Pregnancy    End Method Pregnant/Seeking Pregnancy             Assessment:      1. DUB (dysfunctional uterine bleeding) Bleeding resolved with megace   2. Essential hypertension Continue BP meds and follow up with PCP   3. Patient desires pregnancy Keep period log Try to lose weight before getting pregnant   4. Morbid obesity ([place]) Has lost some weight, encouraged to continue weight loss efforts     Plan:      Follow up prn

## 2022-09-01 ENCOUNTER — Encounter: Payer: 59 | Admitting: Nutrition

## 2022-09-22 ENCOUNTER — Ambulatory Visit: Payer: 59 | Admitting: Nutrition

## 2022-11-10 ENCOUNTER — Telehealth: Payer: Self-pay | Admitting: Family Medicine

## 2022-11-10 ENCOUNTER — Ambulatory Visit: Payer: 59 | Admitting: Family Medicine

## 2022-11-10 NOTE — Telephone Encounter (Signed)
Pt has missed 3 appts as of today (11/02/21, 03/19/22, & 11/10/22). How would you like to proceed?

## 2022-11-18 NOTE — Telephone Encounter (Signed)
Kindly proceed with the clinic policy, given the patient has been fully aware of the clinic's policy and subsequent actions.

## 2022-11-24 ENCOUNTER — Encounter: Payer: Self-pay | Admitting: Family Medicine

## 2023-06-09 ENCOUNTER — Ambulatory Visit: Payer: Self-pay | Admitting: Family

## 2023-06-09 ENCOUNTER — Encounter: Payer: Self-pay | Admitting: Family
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
## 2023-08-22 ENCOUNTER — Encounter (HOSPITAL_COMMUNITY): Payer: Self-pay | Admitting: Emergency Medicine

## 2023-08-22 ENCOUNTER — Emergency Department (HOSPITAL_COMMUNITY)
Admission: EM | Admit: 2023-08-22 | Discharge: 2023-08-22 | Disposition: A | Discharge status: Home / Self Care | Payer: 59 | Attending: Emergency Medicine | Admitting: Emergency Medicine

## 2023-08-22 ENCOUNTER — Other Ambulatory Visit: Payer: Self-pay

## 2023-08-22 DIAGNOSIS — E119 Type 2 diabetes mellitus without complications: Secondary | ICD-10-CM | POA: Insufficient documentation

## 2023-08-22 DIAGNOSIS — M62838 Other muscle spasm: Secondary | ICD-10-CM | POA: Insufficient documentation

## 2023-08-22 DIAGNOSIS — Z91010 Allergy to peanuts: Secondary | ICD-10-CM | POA: Diagnosis not present

## 2023-08-22 DIAGNOSIS — Z7951 Long term (current) use of inhaled steroids: Secondary | ICD-10-CM | POA: Diagnosis not present

## 2023-08-22 DIAGNOSIS — Z7984 Long term (current) use of oral hypoglycemic drugs: Secondary | ICD-10-CM | POA: Diagnosis not present

## 2023-08-22 DIAGNOSIS — J45909 Unspecified asthma, uncomplicated: Secondary | ICD-10-CM | POA: Diagnosis not present

## 2023-08-22 DIAGNOSIS — M542 Cervicalgia: Secondary | ICD-10-CM | POA: Diagnosis not present

## 2023-08-22 DIAGNOSIS — Z79899 Other long term (current) drug therapy: Secondary | ICD-10-CM | POA: Insufficient documentation

## 2023-08-22 DIAGNOSIS — I10 Essential (primary) hypertension: Secondary | ICD-10-CM | POA: Diagnosis not present

## 2023-08-22 DIAGNOSIS — M25512 Pain in left shoulder: Secondary | ICD-10-CM | POA: Diagnosis present

## 2023-08-22 DIAGNOSIS — Z87891 Personal history of nicotine dependence: Secondary | ICD-10-CM | POA: Insufficient documentation

## 2023-08-22 MED ORDER — LIDOCAINE 5 % EX PTCH: 1.0000 | MEDICATED_PATCH | Freq: Every day | CUTANEOUS | 0 refills | Status: AC | PRN

## 2023-08-22 MED ORDER — KETOROLAC TROMETHAMINE 60 MG/2ML IM SOLN
15.0000 mg | Freq: Once | INTRAMUSCULAR | Status: AC
Start: 2023-08-22 — End: 2023-08-22
  Administered 2023-08-22: 15 mg via INTRAMUSCULAR
  Filled 2023-08-22: qty 2

## 2023-08-22 MED ORDER — LIDOCAINE 5 % EX PTCH
1.0000 | MEDICATED_PATCH | Freq: Once | CUTANEOUS | Status: DC
  Administered 2023-08-22: 1 via TRANSDERMAL
  Filled 2023-08-22: qty 1

## 2023-08-22 MED ORDER — CYCLOBENZAPRINE HCL 10 MG PO TABS: 10.0000 mg | ORAL_TABLET | Freq: Once | ORAL | Status: DC

## 2023-08-22 MED ORDER — CYCLOBENZAPRINE HCL 10 MG PO TABS: 10.0000 mg | ORAL_TABLET | Freq: Two times a day (BID) | ORAL | 0 refills | Status: AC | PRN

## 2023-08-22 MED ORDER — ACETAMINOPHEN 500 MG PO TABS
1000.0000 mg | ORAL_TABLET | Freq: Once | ORAL | Status: AC
  Administered 2023-08-22: 1000 mg via ORAL
  Filled 2023-08-22: qty 2

## 2023-08-22 MED ORDER — OXYCODONE HCL 5 MG PO TABS
5.0000 mg | ORAL_TABLET | Freq: Once | ORAL | Status: AC
  Administered 2023-08-22: 5 mg via ORAL
  Filled 2023-08-22: qty 1

## 2023-08-22 MED ORDER — IBUPROFEN 600 MG PO TABS: 600.0000 mg | ORAL_TABLET | Freq: Four times a day (QID) | ORAL | 0 refills | Status: AC | PRN

## 2023-08-22 MED ORDER — ACETAMINOPHEN 325 MG PO TABS: 650.0000 mg | ORAL_TABLET | Freq: Four times a day (QID) | ORAL | 0 refills | Status: AC | PRN
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
## 2023-08-22 NOTE — ED Triage Notes (Signed)
Pt with c/o L sided neck pain that radiates into L shoulder since Friday night. Denies any known injury.

## 2023-08-22 NOTE — Discharge Instructions (Addendum)
It was a pleasure caring for you today in the emergency department.  Recommend no heavy lifting for the next 5 days.  Can alternate ice or heat to the affected area.  Take medications as prescribed.  Do not take muscle relaxers or NSAIDs if you become pregnant. Take ibuprofen/advil with food.   Please return to the emergency department for any worsening or worrisome symptoms.

## 2024-06-26 ENCOUNTER — Encounter (HOSPITAL_COMMUNITY): Payer: Self-pay

## 2024-06-26 ENCOUNTER — Emergency Department (HOSPITAL_COMMUNITY)
Admission: EM | Admit: 2024-06-26 | Discharge: 2024-06-26 | Disposition: A | Discharge status: Home / Self Care | Attending: Emergency Medicine | Admitting: Emergency Medicine

## 2024-06-26 ENCOUNTER — Other Ambulatory Visit: Payer: Self-pay

## 2024-06-26 DIAGNOSIS — S0501XA Injury of conjunctiva and corneal abrasion without foreign body, right eye, initial encounter: Secondary | ICD-10-CM | POA: Insufficient documentation

## 2024-06-26 DIAGNOSIS — Z91010 Allergy to peanuts: Secondary | ICD-10-CM | POA: Diagnosis not present

## 2024-06-26 DIAGNOSIS — H209 Unspecified iridocyclitis: Secondary | ICD-10-CM | POA: Insufficient documentation

## 2024-06-26 DIAGNOSIS — H538 Other visual disturbances: Secondary | ICD-10-CM

## 2024-06-26 DIAGNOSIS — X58XXXA Exposure to other specified factors, initial encounter: Secondary | ICD-10-CM | POA: Insufficient documentation

## 2024-06-26 MED ORDER — ERYTHROMYCIN 5 MG/GM OP OINT
TOPICAL_OINTMENT | Freq: Once | OPHTHALMIC | Status: AC
  Administered 2024-06-26: 1 via OPHTHALMIC
  Filled 2024-06-26: qty 3.5

## 2024-06-26 MED ORDER — ERYTHROMYCIN 5 MG/GM OP OINT: TOPICAL_OINTMENT | Freq: Three times a day (TID) | OPHTHALMIC | 0 refills | Status: AC

## 2024-06-26 MED ORDER — TETRACAINE HCL 0.5 % OP SOLN
2.0000 [drp] | Freq: Once | OPHTHALMIC | Status: AC
  Administered 2024-06-26: 2 [drp] via OPHTHALMIC
  Filled 2024-06-26: qty 4

## 2024-06-26 MED ORDER — FLUORESCEIN SODIUM 1 MG OP STRP
1.0000 | ORAL_STRIP | Freq: Once | OPHTHALMIC | Status: AC
Start: 2024-06-26 — End: 2024-06-26
  Administered 2024-06-26: 1 via OPHTHALMIC
  Filled 2024-06-26: qty 1

## 2024-06-26 NOTE — Discharge Instructions (Addendum)
 You are seen in the emergency room for eye discomfort.  The workup in the emergency room reveals corneal abrasion.  Please apply the antibiotic ointment to your eye.  Please call the ophthalmologist today to set up an appointment in 2 to 3 days.  Return to the emergency room if you start having worsening symptoms (pain and vision).
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
## 2024-06-26 NOTE — ED Notes (Signed)
 ED Provider at bedside.

## 2024-07-02 NOTE — ED Provider Notes (Signed)
 [place] EMERGENCY DEPARTMENT AT [facility] Provider Note   CSN: [id number] Arrival date & time: 06/26/24  1040     Patient presents with: Eye Pain   Michelle Tate is a 28 y.o. female.   HPI    28 year old female comes in with chief complaint of eye irritation and pain.  Patient indicates that while at work yesterday, that somebody had come into clean the room and they were spraying some chemical products in the room.  She felt that something got in her eye.  Ever since then the eye is irritated.  She feels like she might of scratched vigorously, which might have potentially injured her eye and has made her symptoms worse.  She has noted some blurry vision through her right eye.  Patient's pain is worse with light.  Prior to Admission medications   Medication Sig Start Date End Date Taking? Authorizing Provider  erythromycin  ophthalmic ointment Place into the right eye 3 (three) times daily. Place a 1/2 inch ribbon of ointment into the lower eyelid. 06/26/24  Yes Charlyn Sora, MD  acetaminophen  (TYLENOL ) 325 MG tablet Take 2 tablets (650 mg total) by mouth every 6 (six) hours as needed. 08/22/23   Elnor Jayson LABOR, DO  albuterol  (VENTOLIN  HFA) 108 (90 Base) MCG/ACT inhaler Inhale 2 puffs into the lungs every 4 (four) hours as needed for wheezing or shortness of breath. 06/12/20   Waddell, Malena M, DO  amLODipine  (NORVASC ) 5 MG tablet Take 1 tablet (5 mg total) by mouth daily. 07/07/22   Paseda, Folashade R, FNP  cyclobenzaprine  (FLEXERIL ) 10 MG tablet Take 1 tablet (10 mg total) by mouth 2 (two) times daily as needed for muscle spasms. 08/22/23   Elnor Jayson LABOR, DO  EPINEPHrine  0.3 mg/0.3 mL IJ SOAJ injection Inject 0.3 mLs (0.3 mg total) into the muscle once as needed for anaphylaxis. 05/16/20   Iva Marty Saltness, MD  escitalopram  (LEXAPRO ) 10 MG tablet Take 1 tablet (10 mg total) by mouth daily. 11/27/21   Paseda, Folashade R, FNP  fluticasone  (FLONASE ) 50 MCG/ACT nasal spray  Place 2 sprays into both nostrils daily. 05/27/22   Paseda, Folashade R, FNP  ibuprofen  (ADVIL ) 600 MG tablet Take 1 tablet (600 mg total) by mouth every 6 (six) hours as needed. 08/22/23   Elnor Jayson LABOR, DO  lidocaine  (LIDODERM ) 5 % Place 1 patch onto the skin daily as needed. Remove & Discard patch within 12 hours or as directed by MD 08/22/23   Elnor Jayson A, DO  loratadine  (CLARITIN ) 10 MG tablet Take 1 tablet (10 mg total) by mouth daily. 02/09/21   Booker Darice SAUNDERS, FNP  losartan  (COZAAR ) 25 MG tablet Take 1 tablet (25 mg total) by mouth daily. 07/07/22   Paseda, Folashade R, FNP  megestrol  (MEGACE ) 40 MG tablet Take 3 x 5 days then 2 x 5 days then 1 daily til bleeding stops 07/15/22   Signa Delon LABOR, NP  metFORMIN  (GLUCOPHAGE ) 1000 MG tablet Take 1 tablet (1,000 mg total) by mouth 2 (two) times daily with a meal. 07/07/22   Paseda, Folashade R, FNP  Multiple Vitamin (MULTIVITAMIN) tablet Take 1 tablet by mouth daily. 1 gummy daily    [provider]  ondansetron  (ZOFRAN ) 4 MG tablet Take 1 tablet (4 mg total) by mouth every 8 (eight) hours as needed for nausea or vomiting. 08/04/22   Zarwolo, Gloria, FNP  Spacer/Aero-Holding Chambers (AEROCHAMBER MINI CHAMBER) DEVI 1 Units by Does not apply route daily. Use  with inhaler 06/12/20   Luke Vernell BRAVO, MD    Allergies: Banana, Peanut-containing drug products, Penicillins, Sulfa drugs cross reactors, and Vanilla    Review of Systems  All other systems reviewed and are negative.   Updated Vital Signs BP 122/79   Pulse 85   Temp 97.7 F (36.5 C) (Oral)   Resp 18   Ht 5' 7 (1.702 m)   Wt (!) 150 kg   SpO2 100%   BMI 51.79 kg/m   Physical Exam Vitals and nursing note reviewed.  Constitutional:      Appearance: She is well-developed.  HENT:     Head: Atraumatic.     Nose: Nose normal.  Eyes:     Extraocular Movements: Extraocular movements intact.     Pupils: Pupils are equal, round, and reactive to light.     Comments: Gross  visual exam reveals normal finger counting, but patient has some blurry vision Extraocular muscles intact, patient has consensual photophobia on direct photophobia No foreign body noted There is fluorescein  dye uptake, punctate and linear streak over the lower part of the cornea of the right eye  Cardiovascular:     Rate and Rhythm: Normal rate.  Pulmonary:     Effort: Pulmonary effort is normal.  Musculoskeletal:     Cervical back: Normal range of motion and neck supple.  Skin:    General: Skin is warm and dry.  Neurological:     Mental Status: She is alert and oriented to person, place, and time.     (all labs ordered are listed, but only abnormal results are displayed) Labs Reviewed - No data to display  EKG: None  Radiology: No results found.   Procedures   Medications Ordered in the ED  fluorescein  ophthalmic strip 1 strip (1 strip Right Eye Given by Other 06/26/24 1108)  tetracaine  (PONTOCAINE) 0.5 % ophthalmic solution 2 drop (2 drops Right Eye Given by Other 06/26/24 1108)  erythromycin  ophthalmic ointment (1 Application Right Eye Given 06/26/24 1215)                                    Medical Decision Making Risk Prescription drug management.   28 year old female comes in with chief complaint of eye irritation.  She at this time suspecting that the chemicals sprayed in her room yesterday might have triggered a reaction that led to itching in first place, followed by injury from scratching.  Differential diagnosis for this patient includes uveitis because of autoimmune condition, uveitis because of chemical injury, corneal abrasion, chemical conjunctivitis  I do not think patient will need irrigation at this time.  On exam, she has punctate dye uptake and a streak of dye uptake, that is concerning for corneal abrasion.  There is no concerning family history of MS or lupus.  Plan is to put patient on erythromycin  and have her follow-up with  ophthalmology.   Final diagnoses:  Abrasion of right cornea, initial encounter  Blurry vision  Uveitis    ED Discharge Orders          Ordered    erythromycin  ophthalmic ointment  3 times daily        06/26/24 1251               Charlyn Sora, MD 07/02/24 [phone]

## 2024-10-21 ENCOUNTER — Encounter (HOSPITAL_COMMUNITY): Payer: Self-pay | Admitting: Emergency Medicine

## 2024-10-21 ENCOUNTER — Emergency Department (HOSPITAL_COMMUNITY)

## 2024-10-21 ENCOUNTER — Emergency Department (HOSPITAL_COMMUNITY)
Admission: EM | Admit: 2024-10-21 | Discharge: 2024-10-21 | Disposition: A | Discharge status: Home / Self Care | Attending: Emergency Medicine | Admitting: Emergency Medicine

## 2024-10-21 ENCOUNTER — Other Ambulatory Visit: Payer: Self-pay

## 2024-10-21 DIAGNOSIS — Z79899 Other long term (current) drug therapy: Secondary | ICD-10-CM | POA: Diagnosis not present

## 2024-10-21 DIAGNOSIS — I10 Essential (primary) hypertension: Secondary | ICD-10-CM | POA: Insufficient documentation

## 2024-10-21 DIAGNOSIS — M25561 Pain in right knee: Secondary | ICD-10-CM | POA: Insufficient documentation

## 2024-10-21 DIAGNOSIS — Z91010 Allergy to peanuts: Secondary | ICD-10-CM | POA: Insufficient documentation

## 2024-10-21 DIAGNOSIS — M7989 Other specified soft tissue disorders: Secondary | ICD-10-CM | POA: Diagnosis not present

## 2024-10-21 MED ORDER — MELOXICAM 15 MG PO TABS: 15.0000 mg | ORAL_TABLET | Freq: Every day | ORAL | 0 refills | Status: AC

## 2024-10-21 MED ORDER — NAPROXEN 250 MG PO TABS
500.0000 mg | ORAL_TABLET | Freq: Once | ORAL | Status: AC
  Administered 2024-10-21: 500 mg via ORAL
  Filled 2024-10-21: qty 2
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
## 2024-10-21 NOTE — Discharge Instructions (Signed)
 X-ray was normal, see the orthopedist as listed above  RICE therapy:  Apply ice wrapped in a towel intermittently keeping it on the skin no longer than 10 minutes a couple of times an hour  Elevate the affected extremity to help reduce blood flow and prevent swelling  Use an anti-inflammatory if you are not allergic to it such as ibuprofen  or Naprosyn  to help with pain and swelling  Use a compressive device whether it is an Ace wrap or other  immobilizer to help minimize movement and compress the swelling.  Please take Mobic ,  once daily as needed for pain - this in an antiinflammatory medicine (NSAID) and is similar to ibuprofen  - many people feel that it is stronger than ibuprofen  and it is easier to take since it is a smaller pill.  Please use this only for 1 week - if your pain persists, you will need to follow up with your doctor in the office for ongoing guidance and pain control.

## 2024-10-21 NOTE — ED Triage Notes (Signed)
 Patient arrives by POV c/o right knee pain x 5 days. States when she got up one morning she felt it pop and has been having pain since. States she has been limping to try and keep weight off of it.
